# Patient Record
Sex: Female | Born: 1970 | Race: White | Hispanic: No | Marital: Married | Smoking: Current every day smoker
Health system: Southern US, Community
[De-identification: ages and names within clinical notes are randomized; demographics above are authoritative.]

## PROBLEM LIST (undated history)

## (undated) DIAGNOSIS — C539 Malignant neoplasm of cervix uteri, unspecified: Secondary | ICD-10-CM

## (undated) DIAGNOSIS — C44722 Squamous cell carcinoma of skin of right lower limb, including hip: Secondary | ICD-10-CM

## (undated) DIAGNOSIS — N938 Other specified abnormal uterine and vaginal bleeding: Secondary | ICD-10-CM

## (undated) DIAGNOSIS — Z87412 Personal history of vulvar dysplasia: Secondary | ICD-10-CM

## (undated) DIAGNOSIS — Z87411 Personal history of vaginal dysplasia: Secondary | ICD-10-CM

## (undated) DIAGNOSIS — Z8759 Personal history of other complications of pregnancy, childbirth and the puerperium: Secondary | ICD-10-CM

## (undated) DIAGNOSIS — E785 Hyperlipidemia, unspecified: Secondary | ICD-10-CM

## (undated) DIAGNOSIS — D013 Carcinoma in situ of anus and anal canal: Secondary | ICD-10-CM

## (undated) DIAGNOSIS — Z87410 Personal history of cervical dysplasia: Secondary | ICD-10-CM

## (undated) DIAGNOSIS — Z973 Presence of spectacles and contact lenses: Secondary | ICD-10-CM

## (undated) DIAGNOSIS — I10 Essential (primary) hypertension: Secondary | ICD-10-CM

## (undated) DIAGNOSIS — F419 Anxiety disorder, unspecified: Secondary | ICD-10-CM

## (undated) DIAGNOSIS — T7840XA Allergy, unspecified, initial encounter: Secondary | ICD-10-CM

## (undated) HISTORY — PX: COLONOSCOPY: SHX174

## (undated) HISTORY — DX: Allergy, unspecified, initial encounter: T78.40XA

## (undated) HISTORY — DX: Essential (primary) hypertension: I10

## (undated) HISTORY — PX: MOUTH SURGERY: SHX715

## (undated) HISTORY — DX: Squamous cell carcinoma of skin of right lower limb, including hip: C44.722

## (undated) HISTORY — DX: Anxiety disorder, unspecified: F41.9

## (undated) HISTORY — DX: Malignant neoplasm of cervix uteri, unspecified: C53.9

---

## 1997-11-14 ENCOUNTER — Other Ambulatory Visit: Admission: RE | Admit: 1997-11-14 | Discharge: 1997-11-14 | Payer: Self-pay | Admitting: Gynecology

## 1999-05-01 ENCOUNTER — Inpatient Hospital Stay (HOSPITAL_COMMUNITY): Admission: AD | Admit: 1999-05-01 | Discharge: 1999-05-01 | Payer: Self-pay | Admitting: Gynecology

## 1999-05-07 ENCOUNTER — Encounter (INDEPENDENT_AMBULATORY_CARE_PROVIDER_SITE_OTHER): Payer: Self-pay

## 1999-05-07 ENCOUNTER — Encounter: Payer: Self-pay | Admitting: Gynecology

## 1999-05-07 HISTORY — PX: OTHER SURGICAL HISTORY: SHX169

## 1999-05-08 ENCOUNTER — Inpatient Hospital Stay (HOSPITAL_COMMUNITY): Admission: AD | Admit: 1999-05-08 | Discharge: 1999-05-09 | Payer: Self-pay | Admitting: Gynecology

## 1999-08-18 ENCOUNTER — Other Ambulatory Visit: Admission: RE | Admit: 1999-08-18 | Discharge: 1999-08-18 | Payer: Self-pay | Admitting: Gynecology

## 1999-10-09 ENCOUNTER — Other Ambulatory Visit: Admission: RE | Admit: 1999-10-09 | Discharge: 1999-10-09 | Payer: Self-pay | Admitting: Gynecology

## 1999-10-09 ENCOUNTER — Encounter (INDEPENDENT_AMBULATORY_CARE_PROVIDER_SITE_OTHER): Payer: Self-pay

## 1999-11-10 ENCOUNTER — Ambulatory Visit (HOSPITAL_COMMUNITY): Admission: RE | Admit: 1999-11-10 | Discharge: 1999-11-10 | Payer: Self-pay | Admitting: Gynecology

## 1999-11-10 HISTORY — PX: OTHER SURGICAL HISTORY: SHX169

## 2000-02-11 ENCOUNTER — Other Ambulatory Visit: Admission: RE | Admit: 2000-02-11 | Discharge: 2000-02-11 | Payer: Self-pay | Admitting: Gynecology

## 2000-02-12 ENCOUNTER — Other Ambulatory Visit: Admission: RE | Admit: 2000-02-12 | Discharge: 2000-02-12 | Payer: Self-pay | Admitting: Gynecology

## 2000-02-12 ENCOUNTER — Encounter (INDEPENDENT_AMBULATORY_CARE_PROVIDER_SITE_OTHER): Payer: Self-pay | Admitting: Specialist

## 2000-03-02 ENCOUNTER — Other Ambulatory Visit: Admission: RE | Admit: 2000-03-02 | Discharge: 2000-03-02 | Payer: Self-pay | Admitting: Gynecology

## 2000-03-02 ENCOUNTER — Encounter (INDEPENDENT_AMBULATORY_CARE_PROVIDER_SITE_OTHER): Payer: Self-pay

## 2000-03-10 ENCOUNTER — Encounter (INDEPENDENT_AMBULATORY_CARE_PROVIDER_SITE_OTHER): Payer: Self-pay

## 2000-03-10 ENCOUNTER — Ambulatory Visit (HOSPITAL_COMMUNITY): Admission: RE | Admit: 2000-03-10 | Discharge: 2000-03-10 | Payer: Self-pay | Admitting: Gynecology

## 2000-03-10 HISTORY — PX: OTHER SURGICAL HISTORY: SHX169

## 2000-06-13 ENCOUNTER — Other Ambulatory Visit: Admission: RE | Admit: 2000-06-13 | Discharge: 2000-06-13 | Payer: Self-pay | Admitting: Gynecology

## 2000-07-19 ENCOUNTER — Other Ambulatory Visit: Admission: RE | Admit: 2000-07-19 | Discharge: 2000-07-19 | Payer: Self-pay | Admitting: Gynecology

## 2000-09-19 ENCOUNTER — Other Ambulatory Visit: Admission: RE | Admit: 2000-09-19 | Discharge: 2000-09-19 | Payer: Self-pay | Admitting: Gynecology

## 2000-09-20 ENCOUNTER — Other Ambulatory Visit: Admission: RE | Admit: 2000-09-20 | Discharge: 2000-09-20 | Payer: Self-pay | Admitting: Gynecology

## 2000-09-20 ENCOUNTER — Encounter (INDEPENDENT_AMBULATORY_CARE_PROVIDER_SITE_OTHER): Payer: Self-pay | Admitting: *Deleted

## 2000-11-04 ENCOUNTER — Other Ambulatory Visit: Admission: RE | Admit: 2000-11-04 | Discharge: 2000-11-04 | Payer: Self-pay | Admitting: Gynecology

## 2001-04-27 ENCOUNTER — Other Ambulatory Visit: Admission: RE | Admit: 2001-04-27 | Discharge: 2001-04-27 | Payer: Self-pay | Admitting: Gynecology

## 2001-07-07 ENCOUNTER — Ambulatory Visit (HOSPITAL_COMMUNITY): Admission: RE | Admit: 2001-07-07 | Discharge: 2001-07-07 | Payer: Self-pay | Admitting: Gynecology

## 2001-07-07 ENCOUNTER — Encounter: Payer: Self-pay | Admitting: Gynecology

## 2002-10-18 ENCOUNTER — Other Ambulatory Visit: Admission: RE | Admit: 2002-10-18 | Discharge: 2002-10-18 | Payer: Self-pay | Admitting: Gynecology

## 2002-12-06 ENCOUNTER — Inpatient Hospital Stay (HOSPITAL_COMMUNITY): Admission: AD | Admit: 2002-12-06 | Discharge: 2002-12-07 | Payer: Self-pay | Admitting: Gynecology

## 2002-12-06 ENCOUNTER — Encounter (INDEPENDENT_AMBULATORY_CARE_PROVIDER_SITE_OTHER): Payer: Self-pay

## 2002-12-06 HISTORY — PX: OTHER SURGICAL HISTORY: SHX169

## 2003-10-21 ENCOUNTER — Other Ambulatory Visit: Admission: RE | Admit: 2003-10-21 | Discharge: 2003-10-21 | Payer: Self-pay | Admitting: Gynecology

## 2004-11-12 ENCOUNTER — Other Ambulatory Visit: Admission: RE | Admit: 2004-11-12 | Discharge: 2004-11-12 | Payer: Self-pay | Admitting: Gynecology

## 2005-12-21 ENCOUNTER — Other Ambulatory Visit: Admission: RE | Admit: 2005-12-21 | Discharge: 2005-12-21 | Payer: Self-pay | Admitting: Gynecology

## 2006-12-23 ENCOUNTER — Other Ambulatory Visit: Admission: RE | Admit: 2006-12-23 | Discharge: 2006-12-23 | Payer: Self-pay | Admitting: Gynecology

## 2008-01-08 ENCOUNTER — Other Ambulatory Visit: Admission: RE | Admit: 2008-01-08 | Discharge: 2008-01-08 | Payer: Self-pay | Admitting: Gynecology

## 2008-01-08 ENCOUNTER — Ambulatory Visit: Payer: Self-pay | Admitting: Gynecology

## 2008-01-08 ENCOUNTER — Encounter: Payer: Self-pay | Admitting: Gynecology

## 2008-01-30 ENCOUNTER — Encounter: Payer: Self-pay | Admitting: Gynecology

## 2008-01-30 ENCOUNTER — Ambulatory Visit: Payer: Self-pay | Admitting: Gynecology

## 2009-01-08 ENCOUNTER — Other Ambulatory Visit: Admission: RE | Admit: 2009-01-08 | Discharge: 2009-01-08 | Payer: Self-pay | Admitting: Gynecology

## 2009-01-08 ENCOUNTER — Encounter: Payer: Self-pay | Admitting: Gynecology

## 2009-01-08 ENCOUNTER — Ambulatory Visit: Payer: Self-pay | Admitting: Gynecology

## 2009-01-17 ENCOUNTER — Encounter: Admission: RE | Admit: 2009-01-17 | Discharge: 2009-01-17 | Payer: Self-pay | Admitting: Gynecology

## 2009-07-07 ENCOUNTER — Ambulatory Visit: Payer: Self-pay | Admitting: Gynecology

## 2010-01-15 ENCOUNTER — Ambulatory Visit: Payer: Self-pay | Admitting: Gynecology

## 2010-01-15 ENCOUNTER — Other Ambulatory Visit: Admission: RE | Admit: 2010-01-15 | Discharge: 2010-01-15 | Payer: Self-pay | Admitting: Gynecology

## 2010-07-15 ENCOUNTER — Ambulatory Visit (INDEPENDENT_AMBULATORY_CARE_PROVIDER_SITE_OTHER): Payer: Managed Care, Other (non HMO) | Admitting: Gynecology

## 2010-07-15 DIAGNOSIS — D0730 Carcinoma in situ of unspecified female genital organs: Secondary | ICD-10-CM

## 2010-07-17 IMAGING — CR DG CHEST 2V
2 series · 2 of 2 positions shown · non-contrast
Comparison: None

CLINICAL DATA: Smoking history

CHEST - 2 VIEW

[view not recorded (1 of 2)]
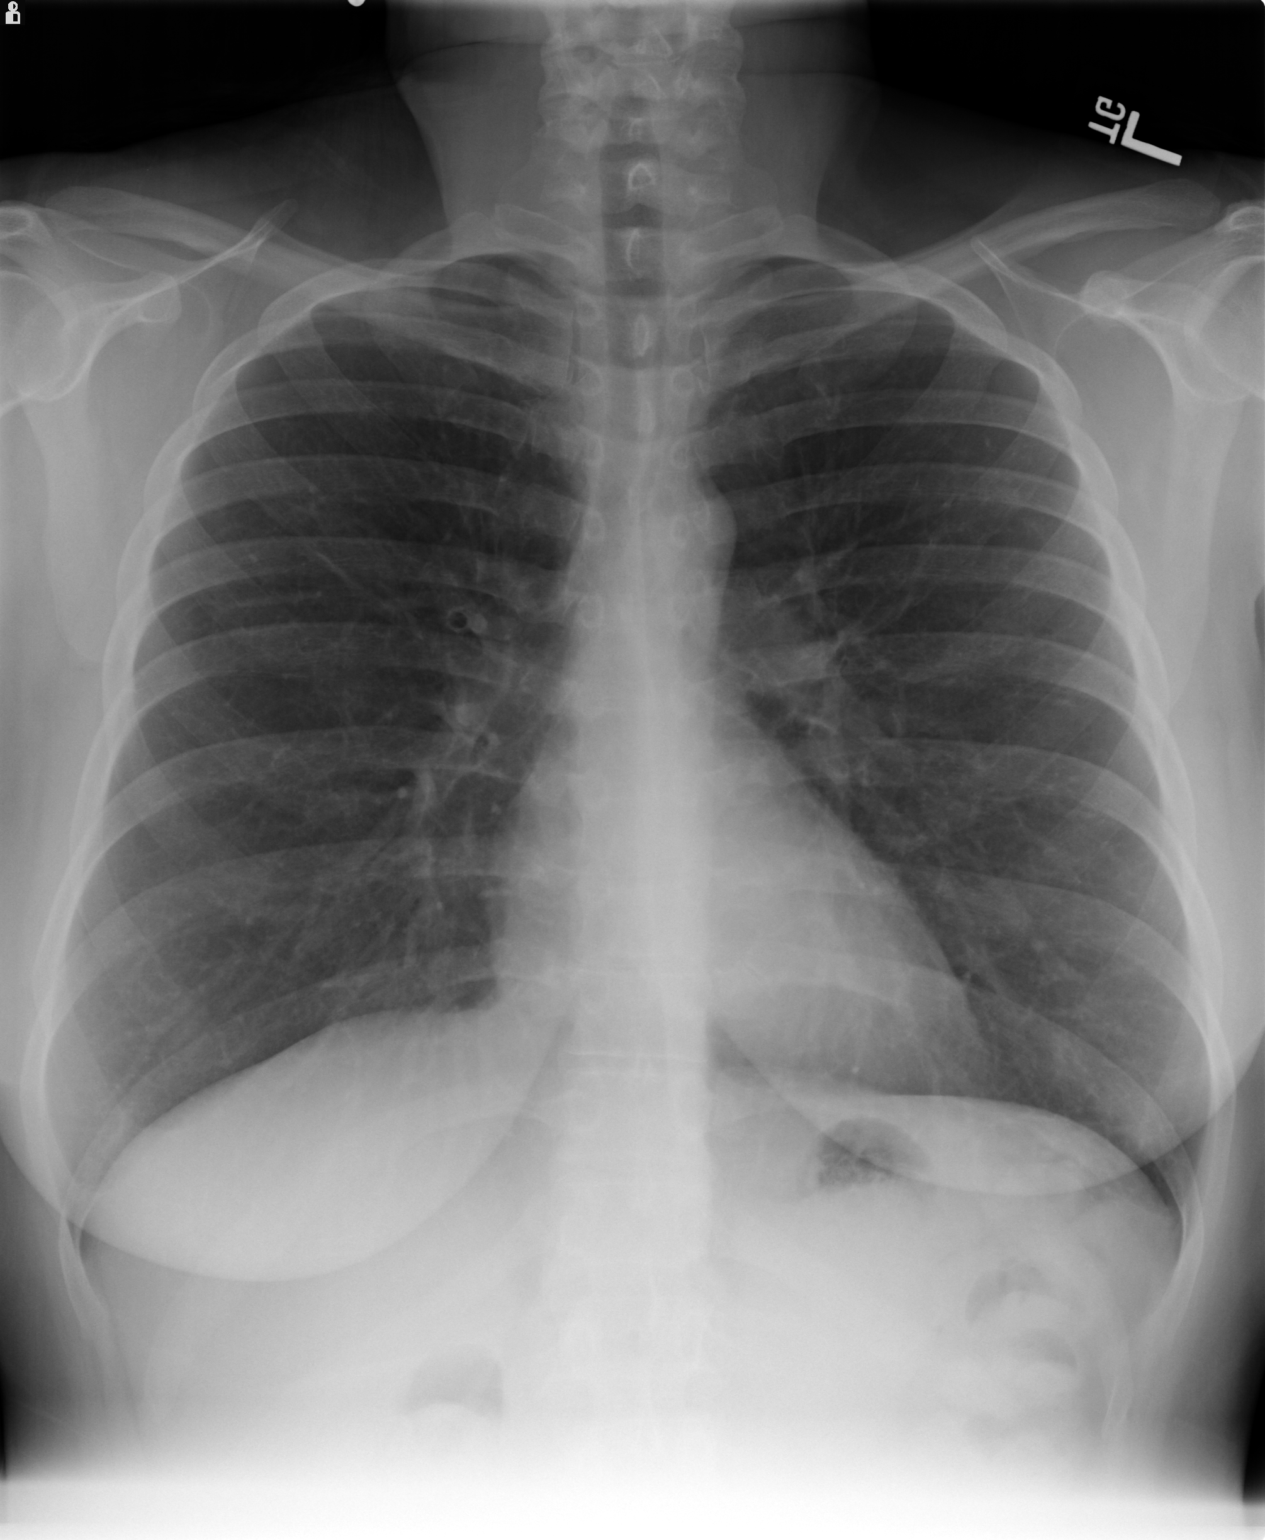

[view not recorded (2 of 2)]
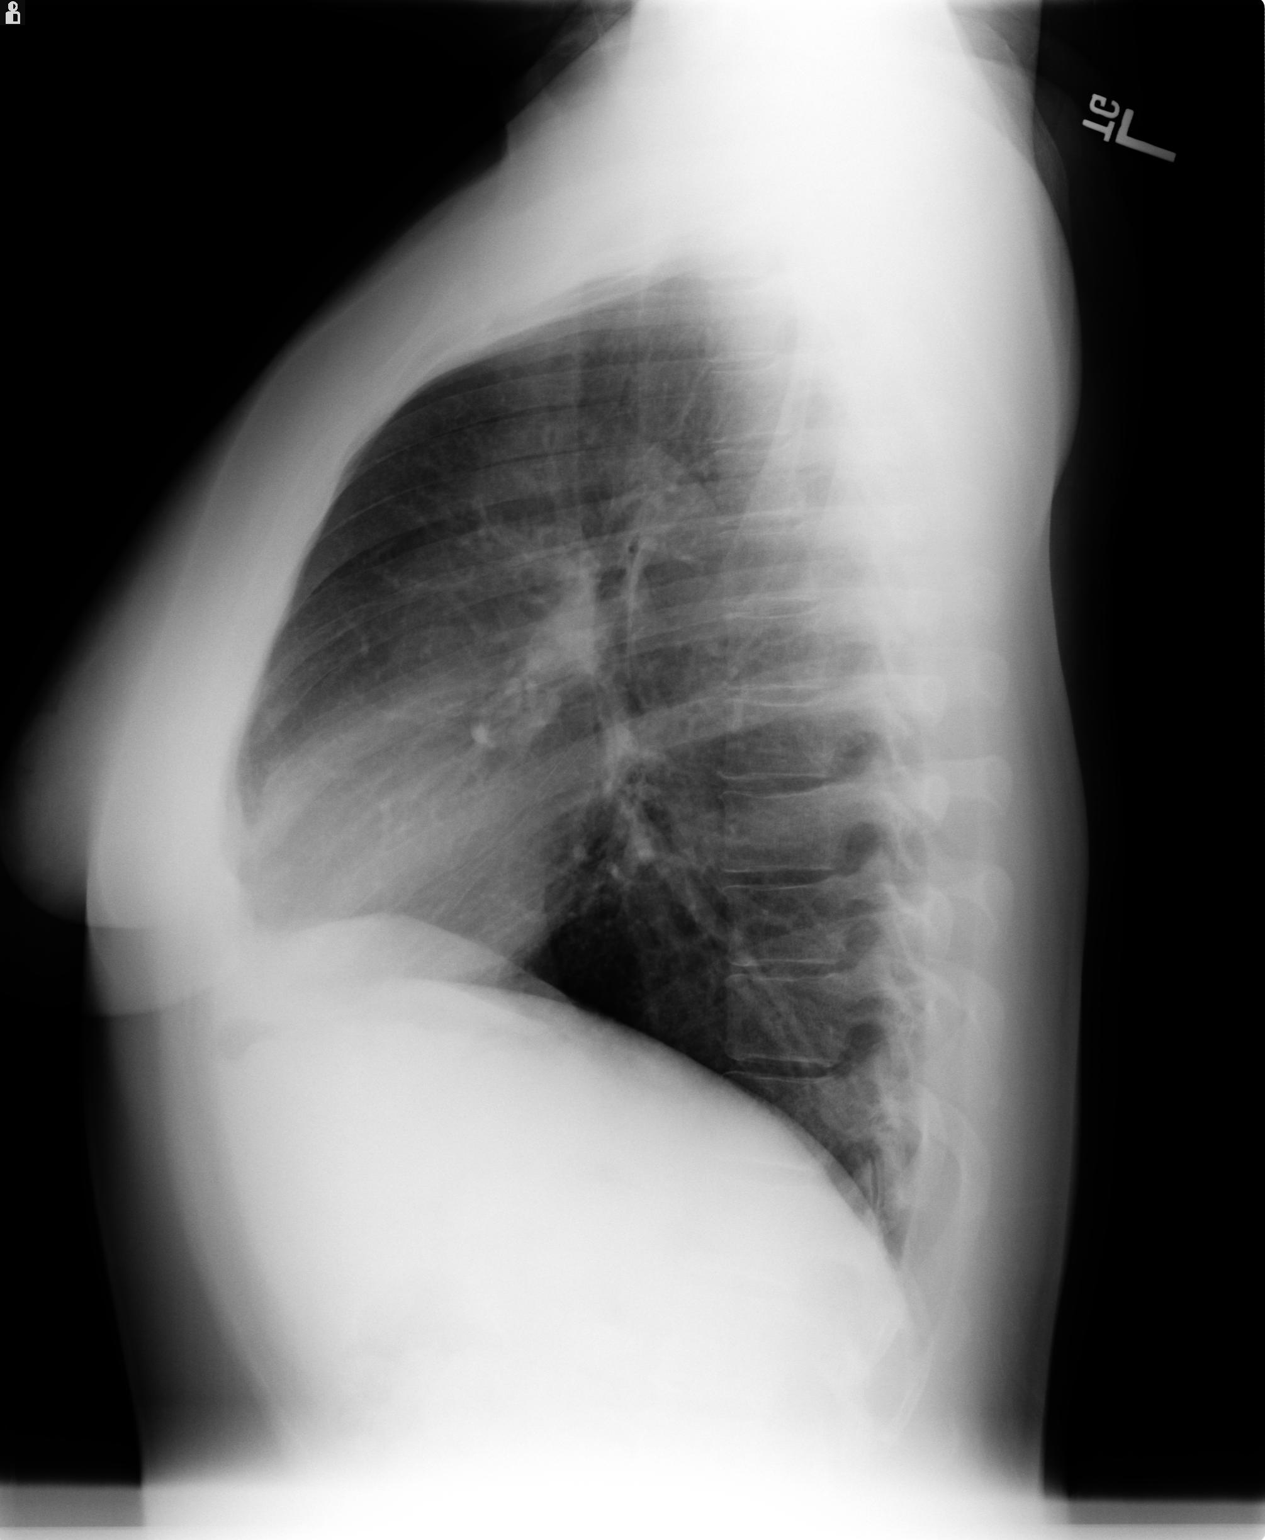

[2 of 2 positions shown; findings below may reference images not displayed]

FINDINGS: The lungs are clear and hyperaerated.  The heart is
within normal limits in size.  No bony abnormality is seen.
IMPRESSION: No active lung disease.  Slight hyperaeration.

## 2010-08-28 NOTE — H&P (Signed)
[facility]  Patient:    Maureen Wall, Maureen Wall                    MRN: [id number] Adm. Date:  [id number] Disc. Date: [id number] Attending:  Tonye Royalty                         History and Physical  The patient is scheduled for surgery on March 10, 2000, at 1 p.m. at [facility].  Please have History & Physical available.  CHIEF COMPLAINT:              1. 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                               2. Bowens disease of left labia minora.                               3. 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  HISTORY:                      The patient is a 40 year old gravida 2, para 0, AB 2 (previous history of right salpingectomy January 2001 secondary to ectopic pregnancy and also missed abortion).  The patient does not use any form of contraception.  She was seen back in the office on November 16 for detailed colposcopic evaluation of the cervix and external genitalia.  She had been seen in the office on November 7 for a followup Pap smear due to the fact that she had CO2 laser ablation of the left vaginal fornix for vaginal intraepithelial neoplasia and left labia minora like lesion.  Her Pap smear demonstrated prior to that procedure CIN-1, but it had not been seen on colposcopic evaluation, so no treatment of cervix was done at that time.  At her followup visit, her Pap smear was repeated, one in the cervix and one in the left vaginal fornix, and both came back CIN-1/VAIN-1.  On the visit of November 1, an ______ lesion of left labium minora was seen, and a keypunch biopsy was obtained.  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  She returned to the office on November 16 where Lugol solution was applied to the vagina, cervix, and external genitalia.  An area of decreased uptake at 12 oclock  at the ectocervix was noted and was biopsied as well as the left vaginal fornix and left vaginal sidewall.  No additional external genital lesions were noted except for the areas of Bowens disease as diagnosed on the left labia minora. The biopsy reports came back cervical biopsy at 12 oclock of the ectocervix surprisingly benign squamous mucosa associated with hyperkeratosis but no dysplasia identified.  The left vaginal fornix and left vaginal sidewall had slight squamous dysplasia VAIN-1, respectively.  The case was discussed with GYN oncologist, Dr. Rande Brunt. Clarke-Pearson who agreed to proceed with wide local excision of the Bowens diseased area of the labia and CO2 laser of the vaginal fornix/cervix.  PAST MEDICAL HISTORY:  1. History of CO2 laser of left vaginal fornix                                  and left labia minora.                               2. History of right salpingectomy in January                                  2001 for right ectopic pregnancy.  ALLERGIES:                    SEPTRA.  GYNECOLOGIC HISTORY:          She has been using barrier contraception.  In 1987, she had a cervical conization and her menarche at age 46.  FAMILY HISTORY:               Brother with history of diabetes, grandmother with history of hypertension.  PHYSICAL EXAMINATION:  GENERAL:                      Well-developed, well-nourished female.  HEENT:                        Unremarkable.  NECK:                         Supple.  Trachea midline.  No carotid bruits, no thyromegaly.  LUNGS:                        Clear to auscultation without rhonchi or wheezes.  HEART:                        Regular rate and rhythm with no murmurs or gallops.  BREASTS:                      Exam not done.  ABDOMEN:                      Soft, nontender.  PELVIC:                       Bartholin, urethra, and Skene within normal limits.  Vagina and cervix as described above.  External  genitalia as described above.  ASSESSMENT:                   A 40 year old gravida 2, para 0, AB 2, with history of recurrent vaginal intraepithelial neoplasia of left vaginal fornix and left vaginal sidewall.  Also, Bowens disease of the left labia minora.  PLAN:                         Proceed with CO2 ablation of the cervix, left vaginal fornix, and left vaginal sidewall along with wide local excision of the left labia minora lesion (Bowens disease).  Risks and benefits of the procedure were discussed including infection, bleeding, trauma from the laser, penetration through vagina into abdominal cavity requiring open laparotomy and corrective surgery, increased risk of cervical stenosis, cervical incompetence, premature delivery due to the  above-mentioned procedure.  All these issues were discussed in detail with the patient, also increased risk for recurrence and also the risk for underlying lesions that had not been visible colposcopically or by visual inspection that could flare up at a later date.  All these issues were discussed in detail with the patient.  All questions were answered and will follow accordingly.  The patient is scheduled for CO2 laser ablation of the cervix, left vaginal fornix, and left vaginal sidewall and wide local excision of left labia minora Bowens disease.  The patients surgery is scheduled for tomorrow at 1 p.m. at [facility].  Please have History & Physical available. DD:  03/09/00 TD:  03/09/00 Job: [id number]

## 2010-08-28 NOTE — Op Note (Signed)
NAMEFARRIN, Maureen Wall                       ACCOUNT NO.:  [id number]   MEDICAL RECORD NO.:  [id number]                   PATIENT TYPE:  INP   LOCATION:  9399                                 FACILITY:  [facility]   PHYSICIAN:  Juan H. Lily Peer, M.D.             DATE OF BIRTH:  11-Apr-1971   DATE OF PROCEDURE:  12/06/2002  DATE OF DISCHARGE:                                 OPERATIVE REPORT   SURGEON:  Juan H. Lily Peer, M.D.   FIRST ASSISTANT:  Ivor Costa. Farrel Gobble, M.D.   INDICATION FOR OPERATION:  This 40 year old gravida 3, para 0, AB 2, (one  prior ectopic, right/previous salpingectomy, and one missed AB), currently 5  1/2 weeks' estimated gestational age.  The patient complaining with left  lower quadrant pain and ultrasound highly suspicious for a left ectopic  pregnancy whereby cardiac activity was noted within the adnexa.   PREOPERATIVE DIAGNOSIS:  Left ectopic pregnancy.   POSTOPERATIVE DIAGNOSIS:  Left ectopic pregnancy.   ANESTHESIA:  General endotracheal anesthesia.   PROCEDURES PERFORMED:  1. Exploratory laparotomy.  2. Lysis of abdominal and pelvic adhesions.  3. Left salpingo-oophorectomy.   FINDINGS:  1. Abdominopelvic adhesions.  2. Left ampullary isthmic ectopic pregnancy, ruptured.  3. Hemoperitoneum.  4. Right ovary absent and absent right fallopian tube.   DESCRIPTION OF OPERATION:  After the patient was adequately counseled she  was taken to the operating room where she underwent a successful general  endotracheal anesthesia.  Foley catheter was inserted to monitor urinary  output and the abdomen was prepped and draped in the usual sterile fashion.  A Pfannenstiel skin incision was made adjacent to the previous Pfannenstiel  scar.  Incision was carried down through the skin, subcutaneous tissue, down  to the rectus fascia, whereby a midline nick was made and the peritoneal  cavity was entered and immediately yellow segments of omentum appeared to be  adhered to the anterior abdominal wall requiring meticulous dissection and  tying off omentum segments in an effort to gain access to the peritoneal  cavity.  Once this was accomplished the patient was placed in slight  Trendelenburg position and the O'Connor-O'Sullivan retractor was utilized  for exposure.  Once this was accomplished it was found that there was  evidence of hemoperitoneum and there was an ampullary isthmic ectopic  pregnancy incased in adhesion to the ovary and a lot of inflammatory  reaction.  We required meticulous dissection in effort to free the  adhesions.  The tissue was very friable on contact.  The mesosalpinx was  serially clamped with a Haney clamp.  Due to the amount of inflammatory  reaction that was present and the friability of the tissue, the left tube  and ovary had to be removed and the remaining infundibulopelvic ligament was  free tied with 0 Vicryl suture x2.  The uterotubal junction was secured with  a figure-of-eight of 0 Vicryl suture.  The pelvic cavity  was copiously  irrigated with a normal saline solution.  The retroperitoneal space was  evaluated to identify the course of the ureter and for additional hemostasis  Gelfoam was placed in the area.  Inspection of the contralateral ovary  appeared to be normal.  No evidence of endometriosis and it was tucked back  behind the uterus.  The sponges were removed and the sponge count and needle  count were correct.  After ascertaining adequate hemostasis, the visceral  peritoneum was not closed.  The rectus fascia was closed in a running stitch  of 0 Vicryl suture.  The subcutaneous bleeders were Bovie cauterized.  The  skin was reapproximated with skin clips followed by a placement of Xeroform  gauze and 4 x 8 dressing.  The patient was extubated and transferred to  recovery room with stable vital signs.  Blood loss from the procedure was  200 mL, urine output was 400 mL, and IV fluids was 2,200 mL of  lactated  Ringers. She did receive 2 grams of Cefotan prophylactically and had PSA  stockings for DVT prophylaxis.  Her blood type is 0-positive.                                               Juan H. Lily Peer, M.D.    JHF/MEDQ  D:  12/06/2002  T:  12/06/2002  Job:  [id number]

## 2010-08-28 NOTE — Discharge Summary (Signed)
Maureen Wall, PENNEBAKER                       ACCOUNT NO.:  [id number]   MEDICAL RECORD NO.:  [id number]                   PATIENT TYPE:  OBV   LOCATION:  9399                                 FACILITY:  [facility]   PHYSICIAN:  Juan H. Lily Peer, M.D.             DATE OF BIRTH:  11/30/1970   DATE OF ADMISSION:  12/06/2002  DATE OF DISCHARGE:                                 DISCHARGE SUMMARY   CHIEF COMPLAINT:  The patient is a 40 year old gravida 3 para 0 AB 2 (prior  miscarriage and prior right salpingectomy).  The patient is currently five-  and-a-half weeks into her pregnancy.  This pregnancy is a result of in vitro  fertilization.  Embryo transfer was on November 08, 2002.  The patient has been  seen in the office several times - August 19, August 23, and on August 24 -  secondary to some vaginal bleeding and crampiness she was having.  Her blood  type is O positive.  Her quantitative beta hCG on August 12 was 24, August  14 was 158, August 16 was 194, and August 18 738, and on August 19 it had  dropped to 472.  At that office visit on August 19 we had offered her either  awaiting spontaneous passage of the missed AB or proceeding with the D&E.  She opted to wait.  She was supposed to return for a follow-up quantitative  beta hCG and, surprisingly, on August 23 a quantitative beta hCG had risen  to 3254.  It was repeated on August 24 and had risen to 4478.  She had an  ultrasound that demonstrated an endometrial stripe of 5.1 mm but no  intrauterine gestational sac was seen, negative adnexal region, no  abnormality seen, normal ovaries, no free fluid.  She presented today to the  office complaining of increased bleeding along with left lower quadrant  cramping.  An ultrasound demonstrated the right ovary was normal.  In the  endometrial cavity the endometrium was 5.4 mm.  There was no intrauterine  pregnancy.  The left ovary measured 4.5 x 3.5 x 3.0 cm.  Inferior to the  ovary is an  echogenic mass measuring 5.3 x 2.5 x 3.1 cm with a gestational  sac consistent with 10.3 mm and a yolk sac consistent with 5.7 mm.  Viable  crown-rump length was measured at 3.2 mm consistent with five weeks and six  days and cardiac activity was appreciated with positive vascular flow to the  suspicious mass.  With these findings I informed Maureen Wall that she has an  ectopic pregnancy.  Review of records indicated in the past with her  previous ectopic pregnancy laparoscopic attempt was unsuccessful and  resulted in open laparotomy with a right salpingectomy for early ectopic  pregnancy.  Since she has been unsuccessful in conceiving by superovulation  and resorted to in vitro it would be better off at this point to remove  her  left fallopian tube and several months after she recovers from her surgery  proceed in continuation of IVF at [facility].   ALLERGIES:  SEPTRA and MORPHINE.   PAST MEDICAL HISTORY:  Wide local excision and CO2 laser of the cervix and  vagina.  She has had history of VIN 3 and cervical dysplasia, history of  prior right ectopic resulting i right salpingectomy.  She smokes a half a  pack of cigarettes per day.   FAMILY HISTORY:  Brother is insulin-dependent diabetic, sister non-insulin-  dependent diabetic, and grandmother history of hypertension.   PHYSICAL EXAMINATION:  VITAL SIGNS:  The patient's weight is 181 pounds, 5  feet 2 inches tall.  Blood pressure 120/82.  HEENT:  Unremarkable.  NECK:  Supple, trachea midline.  No carotid bruits, no thyromegaly.  LUNGS:  Clear to auscultation without rhonchi or wheezes.  HEART:  Regular rate and rhythm.  No murmurs or gallops.  BREAST:  Not done.  ABDOMEN:  Soft.  Some tenderness in the left lower quadrant.  No rebound or  guarding.  PELVIC:  Some blood in her vaginal vault.  Bimanual exam was not done due to  the fact that she was immediately taken to the ultrasound room for rule out  an ectopic pregnancy.  EXTREMITIES:   DTRs 1+, negative clonus, negative edema.   ASSESSMENT:  A 40 year old gravida 3 para 0 AB 2 (one right ectopic and one  missed AB), currently now five-and-a-half weeks estimated gestational age  with an abnormal increments on quantitive beta hCG.  The patient with left  lower quadrant pain.  Ultrasound demonstrated a left ectopic pregnancy with  cardiac activity consistent with five weeks and six days.  The patient will  undergo a mini laparotomy with a left salpingectomy.  We discussed that due  to the fact that she has been now unsuccessful with superovulation and has  now gone through several cycles of IVF that to decrease the likelihood of  her getting another ectopic pregnancy would be to proceed with a  salpingectomy.  Given the technical difficulties encountered before of  extensive abdominopelvic adhesions it was decided to proceed with a mini  laparotomy and with excision of the left fallopian tube.  Risks of the  operation to include infection, bleeding, trauma to internal organs.  The  patient will receive prophylaxis antibiotic.  She is also fully aware in the  event of any trauma to internal organs may require corrective surgery to  bowel, intestines, bladder, and internal structures as well.  Also, the risk  for deep venous thrombosis and pulmonary embolism.  Anesthesia will also  outline potential risks from general endotracheal anesthesia.  All this was  discussed with the patient and the husband, all questions were answered, and  will follow accordingly.   PLAN:  The patient is scheduled for emergency laparotomy with planned left  salpingectomy, possibly a left salpingo-oophorectomy.                                               Juan H. Lily Peer, M.D.    JHF/MEDQ  D:  12/06/2002  T:  12/06/2002  Job:  [id number]

## 2010-08-28 NOTE — Op Note (Signed)
[facility]  Patient:    Maureen Wall                     MRN: [id number] Proc. Date: 11/10/99 Adm. Date:  [id number] Attending:  Tonye Royalty                           Operative Report  PREOPERATIVE DIAGNOSIS:       Vaginal intraepithelial neoplasia I.  POSTOPERATIVE DIAGNOSIS:      Vaginal intraepithelial neoplasia I.  OPERATION:                    A CO2 laser ablation of VAIN I of the left fornix and the left labia minora.  SURGEON:                      Juan H. Lily Peer, M.D.  ASSISTANT:  ANESTHESIA:                   General endotracheal anesthesia.  INDICATIONS:                  A 40 year old female with recurrent dysplasia. Colposcopically directed biopsy demonstrated vaginal intraepithelial neoplasia I in the left vaginal fornix.  No cervical dysplasia noted on colposcopic biopsies. ECC was negative.  DESCRIPTION OF PROCEDURE:     After the patient was adequately counseled, she was taken to the operating room where she underwent a successful general endotracheal anesthesia.  She was placed in the low lithotomy position and drapes were placed around the vagina and perineal area and a coated speculum was inserted into the vaginal vault.  Acetic acid was placed into the vagina and a thorough inspection of the vaginal mucosa and cervix demonstrated a linear white raised area of the left vaginal fornix as previously had been seen in the office colposcopically.  The area of the left vaginal fornix and some scattered lesions on the left vaginal side wall as well as two small isolated lesions noted in the inner surface of the labia majora near the fourchette.  The CO2 laser was utilized for the vaginal fornix. It was set at 10 watts and in a paintbrush-like manner the fornix and vaginal wall lesions were ablated to approximately a depth of 2 mm.  The two isolated lesions of the inner portion of the labia minora were ablated with  the CO2 laser also set at 10 watts for approximately 2 mm depth.  After this, ___________ was placed into the vaginal fornix and the external genitalia for postoperative pain relief.  She was given 30 mg of Toradol IV and transferred to the recovery room where she was extubated with stable vital signs.  Blood loss was minimal. Fluid resuscitation consisted of 1000 cc of lactated Ringers. DD:  11/10/99 TD:  11/11/99 Job: [id number]

## 2010-08-28 NOTE — Op Note (Signed)
[facility]  Patient:    JOYELL, EMAMI                      MRN: [id number] Proc. Date: 03/10/00 Attending:  Gaetano Hawthorne. Lily Peer, M.D.                           Operative Report  INDICATION FOR OPERATION:     A 40 year old gravida 2, para 0, AB 2 with history of recurrent vaginal intraepithelial neoplasia I of the left vaginal fornix and recurrent CIN1 and also Bowens disease of the labia minor (vulvar intraepithelial neoplasia III).  PREOPERATIVE DIAGNOSES:       1. Recurrent vaginal intraepithelial neoplasia                                  of the left vaginal fornix and left vaginal                                  sidewall.                               2. Recurrent cervical intraepithelial                                  neoplasia I.                               3. Bowens disease (vulvar intraepithelial                                  neoplasia III).  POSTOPERATIVE DIAGNOSES:      1. Recurrent vaginal intraepithelial neoplasia                                  of the left vaginal fornix and left vaginal                                  sidewall.                               2. Recurrent cervical intraepithelial                                  neoplasia I.                               3. Bowens disease (vulvar intraepithelial                                  neoplasia III).  ANESTHESIA:                   General endotracheal anesthesia.  PROCEDURES PERFORMED:  1. CO2 laser ablation of cervical and vaginal                                  intraepithelial neoplasia.                               2. Wide local excision of left labia minor,                                  vulvar intraepithelial neoplasia III.  FINDINGS:                     With the use of the colposcope, the entire vaginal tube was moistened with acetic acid ______ wide lesion was noted at the 12 oclock position of the ectocervix also in the left vaginal fornix  and the left vaginal sidewall.  Also the area where a previous key punch biopsy was done on the lower one-third of the left labia minor several small areas of ______ white lesions were noted where the area where the wide local excision was made.  The rest of the external genitalia was normal.  DESCRIPTION OF OPERATION:     After the patient was taken to the operating room, she underwent a successful general endotracheal anesthesia  and she was placed in the low lithotomy position.  The bladder was evacuated of its contents with a red rubber Roxan Hockey for approximately 50 cc.  A titanium coated speculum was placed into the vaginal vault, moist towels were placed around the external genitalia to prevent a fire from occurring from the laser beam.  Once the speculum was in place, a systematic inspection of the entire external, as well as vaginal mucosa and cervix were inspected.  Attention was then placed first through the ectocervix and a circumferential marking with the CO2 laser was made giving at least a 5 mm margin away from the ectocervical lesion.  The CO2 laser was set at 15 watts, continuous mode with a spot size of approximately 2 mm.  The cervix was demarcated was laser ablated to approximately a depth of 4-6 mm.  After this, the wattage was changed to 6 watts continuous mode and a spot size of 2 mm whereby the left vaginal fornix and left vaginal sidewall was brushed in a systematic fashion allowing at least a 3-4 mm distance from the multifocal lesions that were previously noted and biopsied preoperatively.  Once this was completed, attention was focused then on the left labia minor, where a marking pen the area was demarcated to allow significant distance from the lesion that had been biopsied, which had demonstrated VIN3.  Once this was demarcated with the use of a #6 size scalpel, the area was excised placed on a cork and marked and submitted for histologic evaluation.  The base  was cauterized with silver nitrate and the edges were reapproximated with a running stitch of 4-0 Monocryl in a running stitch fashion.  After this, ______ cream was applied intravaginally.  A red rubber Robinson once again inserted into the bladder and approximately 25 cc were obtained.  The patient was extubated and transferred to recovery room with stable vital signs.  Blood loss was minimal. Of note, the power densities from the CO2 laser were as follows: for the cervix it  was 750 watts per cm.sq. and for the vaginal fornix and left vaginal sidewall was 300 watts per cm.sq. DD:  03/10/00 TD:  03/10/00 Job: [id number]

## 2010-08-28 NOTE — Discharge Summary (Signed)
   [name], Maureen Wall                       ACCOUNT NO.:  [id number]   MEDICAL RECORD NO.:  [id number]                   PATIENT TYPE:  INP   LOCATION:  9302                                 FACILITY:  [facility]   PHYSICIAN:  Juan H. Lily Peer, M.D.             DATE OF BIRTH:  15-Oct-1970   DATE OF ADMISSION:  12/06/2002  DATE OF DISCHARGE:  12/07/2002                                 DISCHARGE SUMMARY   DISCHARGE DIAGNOSIS:  Right ectopic pregnancy.   PROCEDURE:  Exploratory laparotomy and left salpingo-oophorectomy.   HISTORY OF PRESENT ILLNESS:  A 40 year old gravida 3 para 0 AB 2 with  history of a prior miscarriage and a right salpingectomy; right ovary is  intact.  The patient is currently five-and-a-half weeks pregnant.  The  pregnancy is the result of in vitro fertilization.  Embryo transfer was on  November 08, 2002.  She had been seen in the office several times secondary to  some vaginal bleeding and cramping.  Blood type is O positive.  An  ultrasound demonstrated that the right ovary was normal.  Endometrium was  5.4 mm and there was no IUP identified.  The left ovary measured normal with  an echogenic mass measuring 5.3 x 2.5 x 3.1 with a gestational sac  consistent with a 10.3 mm yolk sac.  A viable crown-rump length was measured  which was consistent with 5.6 days with cardiac activity.  She was admitted  to the hospital for surgical removal for this left ectopic.   LABORATORY DATA:  She is positive blood type.   HOSPITAL COURSE AND TREATMENT:  She was admitted.  She had surgical removal  of a left ectopic, lysis of adhesions in the pelvis, and a left salpingo-  oophorectomy.  She was noted to have a ruptured left ampulla isthmic  ectopic.  The right ovary was present and appeared normal.  The right  fallopian tube was absent.  Postoperatively she remained afebrile, able to  void, and was discharged home in satisfactory condition on postoperative day  #1.   POSTOPERATIVE  LABORATORY DATA:  White count was 16.7, hemoglobin 12.1,  hematocrit 35.0, and platelets 301.   DISPOSITION:  1. She was discharged to home.  2. Instructed to follow up in the office in several days to have sutures     removed.  3. Prescription for Demerol 50 to take q.4-6h. as needed was given.     Davonna Belling. Young, N.P.                      Lars Mage H. Lily Peer, M.D.    [facility]  D:  12/20/2002  T:  12/20/2002  Job:  [id number]

## 2010-08-28 NOTE — Op Note (Signed)
[facility]  Patient:    Maureen Wall                       MRN: [id number] Proc. Date: 05/07/99 Adm. Date:  [id number] Attending:  Tonye Royalty                           Operative Report  INDICATIONS FOR SURGERY:      The patient is a 40 year old, gravida 2, para 0, B 1, at approximately 6-[redacted] weeks gestation, who had received methotrexate for erotic topic pregnancy, on the 19th of January, presented to the hospital today with acute right lower quadrant pain.  PREOPERATIVE DIAGNOSIS:       Right ectopic pregnancy.  POSTOPERATIVE DIAGNOSIS:      Right ectopic pregnancy.  ANESTHESIA:                   General endotracheal.  OPERATION:                    1. Attempted laparoscopic intervention of right                                  ectopic pregnancy.                               2. Exploratory laparotomy.                               3. Right salpingectomy.  FINDINGS:                     The patient had erotic topic pregnancy incorporating the entire tube, was bleeding at the distal end with large blood clots and approximately 500 cc of blood were in the pelvic cavity.  The right tube was adhered to the right pelvic sidewall.  The left tube was normal.  Mild filmy adhesions were noted with ______ fimbriated end and normal-appearing ovaries. Otherwise anterior and posterior cul-de-sac were unremarkable.  DESCRIPTION OF PROCEDURE:     After the patient was adequately counseled, she was taken to the operating room where she underwent a successful general endotracheal anesthesia.  She was placed in the low lithotomy position.  Examination under anesthesia demonstrated slightly anteverted uterus.  A Foley catheter was inserted in effort to monitor urinary output.  A single-tooth tenaculum was placed on the anterior cervical lip for uterine manipulation during laparoscopic procedure. After the drapes were in place, a small stab  incision was made in the area and he umbilicus following insertion of the Veress needle.  This was unsuccessful and pen laparoscopic technique was attempted with the use of Kelly clamps and the peritoneal cavity once entered, there appeared to be a lot of omentum and we could not obtain a good seal despite placing 0 Vicryl suture in the fascia to obtain  seal with open laparoscopic technique.  The laparoscope was removed.  The fascia was closed.  The subcutaneous tissue was reapproximated with 3-0 Vicryl suture nd the skin was reapproximated with a subcuticular stitch of 4-0 plain catgut suture. After this, a small Pfannenstiel skin incision was made 2 cm above the symphysis pubis and the incision was carried down from the skin and  subcutaneous tissue down to the rectus fascia where the fascia was then incised in a transverse fashion nd the midline raphae was entered.  The peritoneal cavity was entered cautiously.  Blood was noted to be present in the peritoneal cavity.  The OConnor-OSullivan retractors were in place.  At this point, the right fallopian tube appeared adhered to the right pelvic sidewall which was manually freed and incorporated the entire fallopian tube and large blood clots and blood was extruding from the distal portion of the tube.  The tube did not seem to be salvageable and a right salpingectomy was performed by placing Kelly clamps in the mesosalpinx in the proximal uterotubal junction.  With the Metzenbaum scissors the right fallopian  tube was excised and passed off the operative field.  The mesosalpinx was closed with interrupted sutures of 3-0 Vicryl suture.  After this, the pelvic cavity was copiously irrigated with normal saline solution.  Some mild adhesions were noted between the left tube and ovary which were freed with Metzenbaum scissors.  The  pelvic cavity was copiously irrigated with normal saline solution.  The contralateral tube looked  good with lush fimbriated end and both ovaries were otherwise normal.  INTERCEED as then placed over the right mesosalpinx region where the right tube was removed.  The visceral peritoneum was now reapproximated. The fascia was closed with a running stitch of 0 Vicryl suture.  The subcutaneous bleeders were Bovie cauterized.  The skin was reapproximated with skin clips followed by placement of Xeroform gauze and 4 x 8 dressing.  The patient was extubated, transferred to recovery room.  The Hulka tenaculum was removed.  The  patient with stable vital signs.  Blood loss approximately 500 cc.  IV fluids was 2500 cc of lactated Ringers.  Urine output was 150 cc.  The patient received a gram of Cefotan prophylactically and her blood type is O positive. DD:  05/07/99 TD:  05/09/99 Job: [id number]

## 2011-01-13 ENCOUNTER — Encounter: Payer: Self-pay | Admitting: Anesthesiology

## 2011-01-21 ENCOUNTER — Encounter: Payer: Self-pay | Admitting: Gynecology

## 2011-01-22 ENCOUNTER — Encounter: Payer: Self-pay | Admitting: Gynecology

## 2011-01-22 ENCOUNTER — Ambulatory Visit (INDEPENDENT_AMBULATORY_CARE_PROVIDER_SITE_OTHER): Payer: Managed Care, Other (non HMO) | Admitting: Gynecology

## 2011-01-22 ENCOUNTER — Other Ambulatory Visit (HOSPITAL_COMMUNITY)
Admission: RE | Admit: 2011-01-22 | Discharge: 2011-01-22 | Disposition: A | Discharge status: Home / Self Care | Payer: Managed Care, Other (non HMO) | Source: Ambulatory Visit | Attending: Gynecology | Admitting: Gynecology

## 2011-01-22 VITALS — BP 118/70 | Ht 62.5 in | Wt 181.0 lb

## 2011-01-22 DIAGNOSIS — F17200 Nicotine dependence, unspecified, uncomplicated: Secondary | ICD-10-CM

## 2011-01-22 DIAGNOSIS — Z01419 Encounter for gynecological examination (general) (routine) without abnormal findings: Secondary | ICD-10-CM

## 2011-01-22 DIAGNOSIS — D072 Carcinoma in situ of vagina: Secondary | ICD-10-CM | POA: Insufficient documentation

## 2011-01-22 DIAGNOSIS — E7800 Pure hypercholesterolemia, unspecified: Secondary | ICD-10-CM | POA: Insufficient documentation

## 2011-01-22 NOTE — Progress Notes (Signed)
Maureen Wall [birth year] [id number]   History:    40 y.o.  for annual exam without any complaints. The patient with past history of bilateral salpingectomies at different x2 ectopic pregnancies. The patient does her monthly self breast examination last mammogram was yesterday resulting in time of this dictation review records indicated she 1 Ab1 whose weight 159 last year she is being followed by [facility] medical practice and a [facility] and is scheduled for next weeks visit with a we'll be doing all her lab work. Patient has been counseled numerous times she still smokes a half of cigarettes cigarette per day. 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 Patient also with a past history of left salpingo-oophorectomy. She's currently on a statin for hypercholesterolemia. Patient reports normal menstrual cycles.  Past medical history,surgical history, family history and social history were all reviewed and documented in the EPIC chart.  ROS:  Was performed and pertinent positives and negatives are included in the history.  Exam: chaperone present BP 118/70  Ht 5' 2.5" (1.588 m)  Wt 181 lb (82.101 kg)  BMI 32.58 kg/m2  LMP 01/14/2011  Body mass index is 32.58 kg/(m^2).  General appearance : Well developed well nourished female. No acute distress HEENT: Neck supple, trachea midline, no carotid bruits, no thyroidmegaly Lungs: Clear to auscultation, no rhonchi or wheezes, or rib retractions  Heart: Regular rate and rhythm, no murmurs or gallops Breast:Examined in sitting and supine position were symmetrical in appearance, no palpable masses or tenderness,  no skin retraction, no nipple inversion, no nipple discharge, no skin discoloration, no axillary or supraclavicular lymphadenopathy Abdomen: no palpable masses or tenderness, no rebound or guarding Extremities: no edema or skin discoloration or tenderness  Pelvic:  Bartholin, Urethra, Skene  Glands: Within normal limits             Vagina: No gross lesions or discharge  Cervix: No gross lesions or discharge  Uterus  anteverted, normal size, shape and consistency, non-tender and mobile  Adnexa  Without masses or tenderness  Anus and perineum  normal   Rectovaginal  normal sphincter tone without palpated masses or tenderness             Hemoccult not done     Assessment/Plan:  40 y.o. female for annual exam unremarkable. The patient was counseled on the detrimental effects of smoking. She should be taking calcium 1200 mg -1500 mg daily along with 802,000 international units of vitamin D. Her Pap smear was done today she was instructed continue her monthly self breast examination and to have her primary physician fax is her labs so we can obtain her records.    Ok Edwards MD, 10:03 AM 01/22/2011

## 2012-02-10 ENCOUNTER — Other Ambulatory Visit (HOSPITAL_COMMUNITY)
Admission: RE | Admit: 2012-02-10 | Discharge: 2012-02-10 | Disposition: A | Discharge status: Home / Self Care | Payer: 59 | Source: Ambulatory Visit | Attending: Gynecology | Admitting: Gynecology

## 2012-02-10 ENCOUNTER — Encounter: Payer: Self-pay | Admitting: Gynecology

## 2012-02-10 ENCOUNTER — Ambulatory Visit (INDEPENDENT_AMBULATORY_CARE_PROVIDER_SITE_OTHER): Payer: 59 | Admitting: Gynecology

## 2012-02-10 VITALS — BP 128/88 | Ht 62.5 in | Wt 183.0 lb

## 2012-02-10 DIAGNOSIS — Z8759 Personal history of other complications of pregnancy, childbirth and the puerperium: Secondary | ICD-10-CM | POA: Insufficient documentation

## 2012-02-10 DIAGNOSIS — N870 Mild cervical dysplasia: Secondary | ICD-10-CM

## 2012-02-10 DIAGNOSIS — Z01419 Encounter for gynecological examination (general) (routine) without abnormal findings: Secondary | ICD-10-CM | POA: Insufficient documentation

## 2012-02-10 DIAGNOSIS — Z8742 Personal history of other diseases of the female genital tract: Secondary | ICD-10-CM

## 2012-02-10 DIAGNOSIS — Z1151 Encounter for screening for human papillomavirus (HPV): Secondary | ICD-10-CM | POA: Insufficient documentation

## 2012-02-10 DIAGNOSIS — R635 Abnormal weight gain: Secondary | ICD-10-CM | POA: Insufficient documentation

## 2012-02-10 DIAGNOSIS — F17200 Nicotine dependence, unspecified, uncomplicated: Secondary | ICD-10-CM

## 2012-02-10 DIAGNOSIS — Z23 Encounter for immunization: Secondary | ICD-10-CM

## 2012-02-10 NOTE — Patient Instructions (Addendum)

## 2012-02-10 NOTE — Progress Notes (Signed)
Maureen Wall [birth year] [id number]   History:    41 y.o.  for annual gyn exam with no complaints today. Review of patient's record indicated in the past she's had cervical dysplasia treated with CO2 laser. She's also had history of bilateral ectopic pregnancies and has had a left salpingo-oophorectomy and right salpingectomy. She also has had history of VAIN III of left labia minora where she had wide local excision with some marginal involvement. Patient been followed with yearly colposcopic evaluations as well as yearly Pap smears. Her last mammogram was October 2012. Her primary physician is Dr.Slatosky who has been monitoring her hyperlipidemia for which she's currently on a statin. She scheduled to see him the next few weeks and we'll be getting her lab work done. He has prescribed her Xanax in the past for anxiety. She does her monthly self breast examination. She is a chronic smoker of a half a pack cigarette per day  Past medical history,surgical history, family history and social history were all reviewed and documented in the EPIC chart.  Gynecologic History Patient's last menstrual period was 01/28/2012. Contraception: Bilateral salpingectomies Last Pap: 2012. Results were: normal Last mammogram: 2012. Results were: normal  Obstetric History OB History    Grav Para Term Preterm Abortions TAB SAB Ect Mult Living   3 0   3  1 2   0     # Outc Date GA Lbr Len/2nd Wgt Sex Del Anes PTL Lv   1 ECT            2 ECT            3 SAB                ROS: A ROS was performed and pertinent positives and negatives are included in the history.  GENERAL: No fevers or chills. HEENT: No change in vision, no earache, sore throat or sinus congestion. NECK: No pain or stiffness. CARDIOVASCULAR: No chest pain or pressure. No palpitations. PULMONARY: No shortness of breath, cough or wheeze. GASTROINTESTINAL: No abdominal pain, nausea, vomiting or diarrhea, melena or bright red blood per rectum.  GENITOURINARY: No urinary frequency, urgency, hesitancy or dysuria. MUSCULOSKELETAL: No joint or muscle pain, no back pain, no recent trauma. DERMATOLOGIC: No rash, no itching, no lesions. ENDOCRINE: No polyuria, polydipsia, no heat or cold intolerance. No recent change in weight. HEMATOLOGICAL: No anemia or easy bruising or bleeding. NEUROLOGIC: No headache, seizures, numbness, tingling or weakness. PSYCHIATRIC: No depression, no loss of interest in normal activity or change in sleep pattern.     Exam: chaperone present  BP 128/88  Ht 5' 2.5" (1.588 m)  Wt 183 lb (83.008 kg)  BMI 32.94 kg/m2  LMP 01/28/2012  Body mass index is 32.94 kg/(m^2).  General appearance : Well developed well nourished female. No acute distress HEENT: Neck supple, trachea midline, no carotid bruits, no thyroidmegaly Lungs: Clear to auscultation, no rhonchi or wheezes, or rib retractions  Heart: Regular rate and rhythm, no murmurs or gallops Breast:Examined in sitting and supine position were symmetrical in appearance, no palpable masses or tenderness,  no skin retraction, no nipple inversion, no nipple discharge, no skin discoloration, no axillary or supraclavicular lymphadenopathy Abdomen: no palpable masses or tenderness, no rebound or guarding Extremities: no edema or skin discoloration or tenderness  Pelvic:  Bartholin, Urethra, Skene Glands: Within normal limits             Vagina: No gross lesions or discharge  Cervix: No gross lesions  or discharge  Uterus  anteverted, normal size, shape and consistency, non-tender and mobile  Adnexa  Without masses or tenderness  Anus and perineum  normal   Rectovaginal  normal sphincter tone without palpated masses or tenderness             Hemoccult not done     Assessment/Plan:  41 y.o. female for annual exam will return back next month for colposcopic evaluation. 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 Despite the new screening guidelines for Pap smear we will continue to do Pap smears on her on a yearly basis for close surveillance. She was counseled and received a Tdap vaccine today. Her primary physician will be doing her lab work. She was reminded to schedule her mammogram to continue to do her monthly self breast examination. Once again she was counseled on the detrimental effects of smoking. She is in the process of obtaining electrical cigarette to help her quit. We discussed importance of calcium and vitamin D along with regular exercise for osteoporosis prevention.    Ok Edwards MD, 10:43 AM 02/10/2012

## 2012-02-10 NOTE — Addendum Note (Signed)
Addended by: Bertram Savin A on: 02/10/2012 03:51 PM   Modules accepted: Orders

## 2012-03-07 ENCOUNTER — Encounter: Payer: Self-pay | Admitting: Gynecology

## 2012-03-07 ENCOUNTER — Ambulatory Visit (INDEPENDENT_AMBULATORY_CARE_PROVIDER_SITE_OTHER): Payer: 59 | Admitting: Gynecology

## 2012-03-07 VITALS — BP 122/88

## 2012-03-07 DIAGNOSIS — N891 Moderate vaginal dysplasia: Secondary | ICD-10-CM

## 2012-03-07 DIAGNOSIS — N893 Dysplasia of vagina, unspecified: Secondary | ICD-10-CM

## 2012-03-07 DIAGNOSIS — D072 Carcinoma in situ of vagina: Secondary | ICD-10-CM

## 2012-03-07 DIAGNOSIS — Z87412 Personal history of vulvar dysplasia: Secondary | ICD-10-CM

## 2012-03-07 NOTE — Progress Notes (Signed)
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 No lesions were noted. Patient was reassured and she will continue to receive annual Pap smears and annual colposcopic exam.

## 2012-03-23 ENCOUNTER — Encounter: Payer: Self-pay | Admitting: Gynecology

## 2013-02-12 ENCOUNTER — Other Ambulatory Visit (HOSPITAL_COMMUNITY)
Admission: RE | Admit: 2013-02-12 | Discharge: 2013-02-12 | Disposition: A | Discharge status: Home / Self Care | Payer: 59 | Source: Ambulatory Visit | Attending: Gynecology | Admitting: Gynecology

## 2013-02-12 ENCOUNTER — Encounter: Payer: Self-pay | Admitting: Gynecology

## 2013-02-12 ENCOUNTER — Ambulatory Visit (INDEPENDENT_AMBULATORY_CARE_PROVIDER_SITE_OTHER): Payer: 59 | Admitting: Gynecology

## 2013-02-12 VITALS — BP 130/84 | Ht 61.5 in | Wt 181.0 lb

## 2013-02-12 DIAGNOSIS — Z01419 Encounter for gynecological examination (general) (routine) without abnormal findings: Secondary | ICD-10-CM

## 2013-02-12 NOTE — Progress Notes (Signed)
Maureen Wall 15-Mar-1971 [id number]   History:    42 y.o.  for annual gyn exam with no complaints today. 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 Her Tdap and flu vaccine are up-to-date. Her primary physician Dr.Slatosky who has been monitoring her hyperlipidemia for which she's currently on a statin.    Past medical history,surgical history, family history and social history were all reviewed and documented in the EPIC chart.  Gynecologic History Patient's last menstrual period was 01/19/2013. Contraception: Bilateral salpingectomies Last Pap: 2013. Results were: normal Last mammogram: 2013. Results were:  Dense but normal 3Dmammogram done  Obstetric History OB History  Gravida Para Term Preterm AB SAB TAB Ectopic Multiple Living  3 0   3 1  2   0    # Outcome Date GA Lbr Len/2nd Weight Sex Delivery Anes PTL Lv  3 ECT           2 ECT           1 SAB                ROS: A ROS was performed and pertinent positives and negatives are included in the history.  GENERAL: No fevers or chills. HEENT: No change in vision, no earache, sore throat or sinus congestion. NECK: No pain or stiffness. CARDIOVASCULAR: No chest pain or pressure. No palpitations. PULMONARY: No shortness of breath, cough or wheeze. GASTROINTESTINAL: No abdominal pain, nausea, vomiting or diarrhea, melena or bright red blood per  rectum. GENITOURINARY: No urinary frequency, urgency, hesitancy or dysuria. MUSCULOSKELETAL: No joint or muscle pain, no back pain, no recent trauma. DERMATOLOGIC: No rash, no itching, no lesions. ENDOCRINE: No polyuria, polydipsia, no heat or cold intolerance. No recent change in weight. HEMATOLOGICAL: No anemia or easy bruising or bleeding. NEUROLOGIC: No headache, seizures, numbness, tingling or weakness. PSYCHIATRIC: No depression, no loss of interest in normal activity or change in sleep pattern.     Exam: chaperone present  BP 130/84  Ht 5' 1.5" (1.562 m)  Wt 181 lb (82.101 kg)  BMI 33.65 kg/m2  LMP 01/19/2013  Body mass index is 33.65 kg/(m^2).  General appearance : Well developed well nourished female. No acute distress HEENT: Neck supple, trachea midline, no carotid bruits, no thyroidmegaly Lungs: Clear to auscultation, no rhonchi or wheezes, or rib retractions  Heart: Regular rate and rhythm, no murmurs or gallops Breast:Examined in sitting and supine position were symmetrical in appearance, no palpable masses or tenderness,  no skin retraction, no nipple inversion, no nipple discharge, no skin discoloration, no axillary or supraclavicular lymphadenopathy Abdomen: no palpable masses or tenderness, no rebound or guarding Extremities: no edema or skin discoloration or tenderness  Pelvic:  Bartholin, Urethra, Skene Glands: Within normal limits             Vagina: No gross lesions or discharge  Cervix: No gross lesions or discharge  Uterus  anteverted, normal size, shape and consistency, non-tender and mobile  Adnexa  Without masses or tenderness  Anus and perineum  normal   Rectovaginal  normal sphincter tone without palpated masses or tenderness             Hemoccult none indicated     Assessment/Plan:  42 y.o. female for annual exam with past history of vaginal and cervical dysplasia in the past. See above for details. Pap smear was done today. Magnification lens was used  to inspect her external genitalia, cervix, fornix, perineum, perirectal region with no grossly she is on inspection. Patient was minus get her mammogram. She was not to do monthly self breast examination. She was once again counseled on the detrimental effects of smoking. She could not tolerate Chantix. We discussed importance of calcium and regular exercise for osteoporosis prevention. Patient was reminded to schedule her mammogram for next month. PCP did her lab work.  Note: This dictation was prepared with  Dragon/digital dictation along withSmart phrase technology. Any transcriptional errors that result from this process are unintentional.   Ok Edwards MD, 9:30 AM 02/12/2013

## 2013-02-23 ENCOUNTER — Encounter: Payer: Self-pay | Admitting: Gynecology

## 2013-03-23 ENCOUNTER — Encounter: Payer: Self-pay | Admitting: Gynecology

## 2013-10-16 ENCOUNTER — Encounter: Payer: Self-pay | Admitting: Gynecology

## 2013-10-16 ENCOUNTER — Ambulatory Visit (INDEPENDENT_AMBULATORY_CARE_PROVIDER_SITE_OTHER): Payer: 59 | Admitting: Gynecology

## 2013-10-16 VITALS — BP 122/78

## 2013-10-16 DIAGNOSIS — N949 Unspecified condition associated with female genital organs and menstrual cycle: Secondary | ICD-10-CM

## 2013-10-16 DIAGNOSIS — N938 Other specified abnormal uterine and vaginal bleeding: Secondary | ICD-10-CM

## 2013-10-16 DIAGNOSIS — N925 Other specified irregular menstruation: Secondary | ICD-10-CM

## 2013-10-16 NOTE — Patient Instructions (Signed)
Transvaginal Ultrasound Transvaginal ultrasound is a pelvic ultrasound, using a metal probe that is placed in the vagina, to look at a women's female organs. Transvaginal ultrasound is a method of seeing inside the pelvis of a woman. The ultrasound machine sends out sound waves from the transducer (probe). These sound waves bounce off body structures (like an echo) to create a picture. The picture shows up on a monitor. It is called transvaginal because the probe is inserted into the vagina. There should be very little discomfort from the vaginal probe. This test can also be used during pregnancy. Endovaginal ultrasound is another name for a transvaginal ultrasound. In a transabdominal ultrasound, the probe is placed on the outside of the belly. This method gives pictures that are lower quality than pictures from the transvaginal technique. Transvaginal ultrasound is used to look for problems of the female genital tract. Some such problems include:  Infertility problems.  Congenital (birth defect) malformations of the uterus and ovaries.  Tumors in the uterus.  Abnormal bleeding.  Ovarian tumors and cysts.  Abscess (inflamed tissue around pus) in the pelvis.  Unexplained abdominal or pelvic pain.  Pelvic infection. DURING PREGNANCY, TRANSVAGINAL ULTRASOUND MAY BE USED TO LOOK AT:  Normal pregnancy.  Ectopic pregnancy (pregnancy outside the uterus).  Fetal heartbeat.  Abnormalities in the pelvis, that are not seen well with transabdominal ultrasound.  Suspected twins or multiples.  Impending miscarriage.  Problems with the cervix (incompetent cervix, not able to stay closed and hold the baby).  When doing an amniocentesis (removing fluid from the pregnancy sac, for testing).  Looking for abnormalities of the baby.  Checking the growth, development, and age of the fetus.  Measuring the amount of fluid in the amniotic sac.  When doing an external version of the baby (moving  baby into correct position).  Evaluating the baby for problems in high risk pregnancies (biophysical profile).  Suspected fetal demise (death). Sometimes a special ultrasound method called Saline Infusion Sonography (SIS) is used for a more accurate look at the uterus. Sterile saline (salt water) is injected into the uterus of non-pregnant patients to see the inside of the uterus better. SIS is not used on pregnant women. The vaginal probe can also assist in obtaining biopsies of abnormal areas, in draining fluid from cysts on the ovary, and in finding IUDs (intrauterine device, birth control) that cannot be located. PREPARATION FOR TEST A transvaginal ultrasound is done with the bladder empty. The transabdominal ultrasound is done with your bladder full. You may be asked to drink several glasses of water before that exam. Sometimes, a transabdominal ultrasound is done just after a transvaginal ultrasound, to look at organs in your abdomen. PROCEDURE  You will lie down on a table, with your knees bent and your feet in foot holders. The probe is covered with a condom. A sterile lubricant is put into the vagina and on the probe. The lubricant helps transmit the sound waves and avoid irritating the vagina. Your caregiver will move the probe inside the vaginal cavity to scan the pelvic structures. A normal test will show a normal pelvis and normal contents. An abnormal test will show abnormalities of the pelvis, placenta, or baby. ABNORMAL RESULTS MAY BE DUE TO:  Growths or tumors in the:  Uterus.  Ovaries.  Vagina.  Other pelvic structures.  Non-cancerous growths of the uterus and ovaries.  Twisting of the ovary, cutting off blood supply to the ovary (ovarian torsion).  Areas of infection, including:  Pelvic  inflammatory disease.  Abscess in the pelvis.  Locating an IUD. PROBLEMS FOUND IN PREGNANT WOMEN MAY INCLUDE:  Ectopic pregnancy (pregnancy outside the uterus).  Multiple  pregnancies.  Early dilation (opening) of the cervix. This may indicate an incompetent cervix and early delivery.  Impending miscarriage.  Fetal death.  Problems with the placenta, including:  Placenta has grown over the opening of the womb (placenta previa).  Placenta has separated early in the womb (placental abruption).  Placenta grows into the muscle of the uterus (placenta accreta).  Tumors of pregnancy, including gestational trophoblastic disease. This is an abnormal pregnancy, with no fetus. The uterus is filled with many grape-like cysts that could sometimes be cancerous.  Incorrect position of the fetus (breech, vertex).  Intrauterine fetal growth retardation (IUGR) (poor growth in the womb).  Fetal abnormalities or infection. RISKS AND COMPLICATIONS There are no known risks to the ultrasound procedure. There is no X-ray used when doing an ultrasound. Document Released: 03/10/2004 Document Revised: 06/21/2011 Document Reviewed: 02/26/2009 [facility] Patient Information 2015 [place], [place]. This information is not intended to replace advice given to you by your health care provider. Make sure you discuss any questions you have with your health care provider. Endometrial Biopsy Endometrial biopsy is a procedure in which a tissue sample is taken from inside the uterus. The tissue sample is then looked at under a microscope to see if the tissue is normal or abnormal. The endometrium is the lining of the uterus. This procedure helps determine where you are in your menstrual cycle and how hormone levels are affecting the lining of the uterus. This procedure may also be used to evaluate uterine bleeding or to diagnose endometrial cancer, tuberculosis, polyps, or inflammatory conditions.  LET [facility] CARE PROVIDER KNOW ABOUT:  Any allergies you have.  All medicines you are taking, including vitamins, herbs, eye drops, creams, and over-the-counter medicines.  Previous problems  you or members of your family have had with the use of anesthetics.  Any blood disorders you have.  Previous surgeries you have had.  Medical conditions you have.  Possibility of pregnancy. RISKS AND COMPLICATIONS Generally, this is a safe procedure. However, as with any procedure, complications can occur. Possible complications include:  Bleeding.  Pelvic infection.  Puncture of the uterine wall with the biopsy device (rare). BEFORE THE PROCEDURE   Keep a record of your menstrual cycles as directed by your health care provider. You may need to schedule your procedure for a specific time in your cycle.  You may want to bring a sanitary pad to wear home after the procedure.  Arrange for someone to drive you home after the procedure if you will be given a medicine to help you relax (sedative). PROCEDURE   You may be given a sedative to relax you.  You will lie on an exam table with your feet and legs supported as in a pelvic exam.  Your health care provider will insert an instrument (speculum) into your vagina to see your cervix.  Your cervix will be cleansed with an antiseptic solution. A medicine (local anesthetic) will be used to numb the cervix.  A forceps instrument (tenaculum) will be used to hold your cervix steady for the biopsy.  A thin, rodlike instrument (uterine sound) will be inserted through your cervix to determine the length of your uterus and the location where the biopsy sample will be removed.  A thin, flexible tube (catheter) will be inserted through your cervix and into the uterus. The  catheter is used to collect the biopsy sample from your endometrial tissue.  The catheter and speculum will then be removed, and the tissue sample will be sent to a lab for examination. AFTER THE PROCEDURE  You will rest in a recovery area until you are ready to go home.  You may have mild cramping and a small amount of vaginal bleeding for a few days after the  procedure. This is normal.  Make sure you find out how to get your test results. Document Released: 07/30/2004 Document Revised: 11/29/2012 Document Reviewed: 09/13/2012 [facility] Patient Information 2015 [place], [place]. This information is not intended to replace advice given to you by your health care provider. Make sure you discuss any questions you have with your health care provider.  Perimenopause Perimenopause is the time when your body begins to move into the menopause (no menstrual period for 12 straight months). It is a natural process. Perimenopause can begin 2-8 years before the menopause and usually lasts for 1 year after the menopause. During this time, your ovaries may or may not produce an egg. The ovaries vary in their production of estrogen and progesterone hormones each month. This can cause irregular menstrual periods, difficulty getting pregnant, vaginal bleeding between periods, and uncomfortable symptoms. CAUSES  Irregular production of the ovarian hormones, estrogen and progesterone, and not ovulating every month.  Other causes include:  Tumor of the pituitary gland in the brain.  Medical disease that affects the ovaries.  Radiation treatment.  Chemotherapy.  Unknown causes.  Heavy smoking and excessive alcohol intake can bring on perimenopause sooner. SIGNS AND SYMPTOMS   Hot flashes.  Night sweats.  Irregular menstrual periods.  Decreased sex drive.  Vaginal dryness.  Headaches.  Mood swings.  Depression.  Memory problems.  Irritability.  Tiredness.  Weight gain.  Trouble getting pregnant.  The beginning of losing bone cells (osteoporosis).  The beginning of hardening of the arteries (atherosclerosis). DIAGNOSIS  Your health care provider will make a diagnosis by analyzing your age, menstrual history, and symptoms. He or she will do a physical exam and note any changes in your body, especially your female organs. Female hormone tests  may or may not be helpful depending on the amount of female hormones you produce and when you produce them. However, other hormone tests may be helpful to rule out other problems. TREATMENT  In some cases, no treatment is needed. The decision on whether treatment is necessary during the perimenopause should be made by you and your health care provider based on how the symptoms are affecting you and your lifestyle. Various treatments are available, such as:  Treating individual symptoms with a specific medicine for that symptom.  Herbal medicines that can help specific symptoms.  Counseling.  Group therapy. HOME CARE INSTRUCTIONS   Keep track of your menstrual periods (when they occur, how heavy they are, how long between periods, and how long they last) as well as your symptoms and when they started.  Only take over-the-counter or prescription medicines as directed by your health care provider.  Sleep and rest.  Exercise.  Eat a diet that contains calcium (good for your bones) and soy (acts like the estrogen hormone).  Do not smoke.  Avoid alcoholic beverages.  Take vitamin supplements as recommended by your health care provider. Taking vitamin E may help in certain cases.  Take calcium and vitamin D supplements to help prevent bone loss.  Group therapy is sometimes helpful.  Acupuncture may help in some cases.  SEEK MEDICAL CARE IF:   You have questions about any symptoms you are having.  You need a referral to a specialist (gynecologist, psychiatrist, or psychologist). SEEK IMMEDIATE MEDICAL CARE IF:   You have vaginal bleeding.  Your period lasts longer than 8 days.  Your periods are recurring sooner than 21 days.  You have bleeding after intercourse.  You have severe depression.  You have pain when you urinate.  You have severe headaches.  You have vision problems. Document Released: 05/06/2004 Document Revised: 01/17/2013 Document Reviewed:  10/26/2012 [facility] Patient Information 2015 [place], [place]. This information is not intended to replace advice given to you by your health care provider. Make sure you discuss any questions you have with your health care provider.

## 2013-10-16 NOTE — Progress Notes (Signed)
   Patient is a 43 year old who presented to the office stating that her menstrual cycles were normal until May of this year. She has experienced 3 bleeding episodes in the month of June (June 3, June 12, in June 24 for 2-3 days. Patient denies any visual disturbances or any unusual headaches or nipple discharge. She reports no weight changes and no new medication. She otherwise has been doing well. She does have history of bilateral salpingectomy in the past as a result of ectopic pregnancy.  Exam: Bartholin urethra Skene was within normal limits Vagina: No lesions or discharge Cervix: No lesions or discharge Uterus: Upper limits of normal anteverted Adnexa: No palpable mass or tenderness  Patient was counseled for endometrial biopsy. She had a normal Pap smear 7 months ago. The cervix required minimal dilatation to facilitate insertion of the [place] after the cervix was cleansed with Betadine solution. The tissue that was obtained was submitted for histological evaluation  Assessment/plan: 43 year old with dysfunction uterine bleeding endometrial biopsy done today results pending at time of this dictation. We will check today TSH, prolactin and FSH and she will return back next week for some infusion histogram to complete the evaluation for dysfunctional uterine bleeding to rule out any intrauterine abnormality such as a submucous myoma or endometrial polyp.

## 2013-10-17 LAB — FOLLICLE STIMULATING HORMONE: FSH: 2.8 m[IU]/mL

## 2013-10-17 LAB — PROLACTIN: Prolactin: 13.2 ng/mL

## 2013-10-17 LAB — TSH: TSH: 4.809 u[IU]/mL — AB (ref 0.350–4.500)

## 2013-10-19 ENCOUNTER — Other Ambulatory Visit: Payer: Self-pay | Admitting: Gynecology

## 2013-10-19 DIAGNOSIS — R7989 Other specified abnormal findings of blood chemistry: Secondary | ICD-10-CM

## 2013-10-26 ENCOUNTER — Other Ambulatory Visit: Payer: Self-pay | Admitting: Gynecology

## 2013-10-26 DIAGNOSIS — N938 Other specified abnormal uterine and vaginal bleeding: Secondary | ICD-10-CM

## 2013-11-02 ENCOUNTER — Ambulatory Visit (INDEPENDENT_AMBULATORY_CARE_PROVIDER_SITE_OTHER): Payer: 59

## 2013-11-02 ENCOUNTER — Ambulatory Visit (INDEPENDENT_AMBULATORY_CARE_PROVIDER_SITE_OTHER): Payer: 59 | Admitting: Gynecology

## 2013-11-02 DIAGNOSIS — N926 Irregular menstruation, unspecified: Secondary | ICD-10-CM

## 2013-11-02 DIAGNOSIS — N83209 Unspecified ovarian cyst, unspecified side: Secondary | ICD-10-CM

## 2013-11-02 DIAGNOSIS — N949 Unspecified condition associated with female genital organs and menstrual cycle: Secondary | ICD-10-CM

## 2013-11-02 DIAGNOSIS — N938 Other specified abnormal uterine and vaginal bleeding: Secondary | ICD-10-CM

## 2013-11-02 DIAGNOSIS — N83201 Unspecified ovarian cyst, right side: Secondary | ICD-10-CM | POA: Insufficient documentation

## 2013-11-02 DIAGNOSIS — R7989 Other specified abnormal findings of blood chemistry: Secondary | ICD-10-CM

## 2013-11-02 DIAGNOSIS — R946 Abnormal results of thyroid function studies: Secondary | ICD-10-CM

## 2013-11-02 NOTE — Progress Notes (Signed)
   Patient presented to the office today as a continuation of her workup for her dysfunctional uterine bleeding. She was seen in the office on July 7 stating that her menstrual cycles were normal until May of this year. She has experienced 3 bleeding episodes in the month of June (June 3, June 12, in June 24 for 2-3 days. Patient denies any visual disturbances or any unusual headaches or nipple discharge. She reports no weight changes and no new medication. She otherwise has been doing well. She does have history of bilateral salpingectomy in the past as a result of ectopic pregnancy.  She had an endometrial biopsy which demonstrated the following: Diagnosis Endometrium, biopsy, uterus - SECRETORY ENDOMETRIUM. - METAPLASTIC SQUAMOUS EPITHELIUM. - NO HYPERPLASIA OR MALIGNANCY.  She also had an [place] and prolactin which were in the normal range but H. was found to be elevated at 4.809. We repeated her thyroid function tests and results are pending.  Ultrasound/sono histogram today: Uterus measured 8.8 x 5.0 x 3.7 cm with endometrial stripe of a 20 mm. Left ovary was normal. Right ovarian ankle free thin wall vascular cyst measuring 23 x 19 mm possible corpus luteum cyst. The cervix was then cleansed with Betadine solution a sterile catheter was introduced into the uterine cavity and normal saline was instilled. There was no intracavitary defect noted.  Assessment/plan: Patient with irregular cycle in the month of June and now in July with no abnormality noted on sonohysterogram as well as normal TSH and prolactin. TSH was elevated repeated today to confirm that she does have hypothyroidism which may explain her irregular bleeding. She will be placed me while on Prometrium 200 mg one by mouth daily for 12 days of each month for the next 3 months. She will return back to the office in 3 months for a followup ultrasound. Will await the results of the thyroid function test. I provided her with literature  information on hypothyroidism as well as ovarian cysts and Synthroid

## 2013-11-02 NOTE — Patient Instructions (Signed)
Please take the Prometrium 200 mg one tablet before bedtime for the first 12 days of the month for the next 3 months  We will need a followup ultrasound on the small cysts in 3 months  We will let you know the results of the thyroid function test next week. Meanwhile please read the following information on ovarian cysts and on hypothyroidism  Hypothyroidism The thyroid is a large gland located in the lower front of your neck. The thyroid gland helps control metabolism. Metabolism is how your body handles food. It controls metabolism with the hormone thyroxine. When this gland is underactive (hypothyroid), it produces too little hormone.  CAUSES These include:   Absence or destruction of thyroid tissue.  Goiter due to iodine deficiency.  Goiter due to medications.  Congenital defects (since birth).  Problems with the pituitary. This causes a lack of TSH (thyroid stimulating hormone). This hormone tells the thyroid to turn out more hormone. SYMPTOMS  Lethargy (feeling as though you have no energy)  Cold intolerance  Weight gain (in spite of normal food intake)  Dry skin  Coarse hair  Menstrual irregularity (if severe, may lead to infertility)  Slowing of thought processes Cardiac problems are also caused by insufficient amounts of thyroid hormone. Hypothyroidism in the newborn is cretinism, and is an extreme form. It is important that this form be treated adequately and immediately or it will lead rapidly to retarded physical and mental development. DIAGNOSIS  To prove hypothyroidism, your caregiver may do blood tests and ultrasound tests. Sometimes the signs are hidden. It may be necessary for your caregiver to watch this illness with blood tests either before or after diagnosis and treatment. TREATMENT  Low levels of thyroid hormone are increased by using synthetic thyroid hormone. This is a safe, effective treatment. It usually takes about four weeks to gain the full effects  of the medication. After you have the full effect of the medication, it will generally take another four weeks for problems to leave. Your caregiver may start you on low doses. If you have had heart problems the dose may be gradually increased. It is generally not an emergency to get rapidly to normal. HOME CARE INSTRUCTIONS   Take your medications as your caregiver suggests. Let your caregiver know of any medications you are taking or start taking. Your caregiver will help you with dosage schedules.  As your condition improves, your dosage needs may increase. It will be necessary to have continuing blood tests as suggested by your caregiver.  Report all suspected medication side effects to your caregiver. SEEK MEDICAL CARE IF: Seek medical care if you develop:  Sweating.  Tremulousness (tremors).  Anxiety.  Rapid weight loss.  Heat intolerance.  Emotional swings.  Diarrhea.  Weakness. SEEK IMMEDIATE MEDICAL CARE IF:  You develop chest pain, an irregular heart beat (palpitations), or a rapid heart beat. MAKE SURE YOU:   Understand these instructions.  Will watch your condition.  Will get help right away if you are not doing well or get worse. Document Released: 03/29/2005 Document Revised: 06/21/2011 Document Reviewed: 11/17/2007 [facility] Patient Information 2015 [place], [place]. This information is not intended to replace advice given to you by your health care provider. Make sure you discuss any questions you have with your health care provider.  Levothyroxine tablets What is this medicine? LEVOTHYROXINE (lee voe thye ROX een) is a thyroid hormone. This medicine can improve symptoms of thyroid deficiency such as slow speech, lack of energy, weight gain, hair  loss, dry skin, and feeling cold. It also helps to treat goiter (an enlarged thyroid gland). It is also used to treat some kinds of thyroid cancer along with surgery and other medicines. This medicine may be used for  other purposes; ask your health care provider or pharmacist if you have questions. COMMON BRAND NAME(S): Estre, Levo-T, Levothroid, Levoxyl, Synthroid, Thyro-Tabs, Unithroid What should I tell my health care provider before I take this medicine? They need to know if you have any of these conditions: -angina -blood clotting problems -diabetes -dieting or on a weight loss program -fertility problems -heart disease -high levels of thyroid hormone -pituitary gland problem -previous heart attack -an unusual or allergic reaction to levothyroxine, thyroid hormones, other medicines, foods, dyes, or preservatives -pregnant or trying to get pregnant -breast-feeding How should I use this medicine? Take this medicine by mouth with plenty of water. It is best to take on an empty stomach, at least 30 minutes before or 2 hours after food. Follow the directions on the prescription label. Take at the same time each day. Do not take your medicine more often than directed. Contact your pediatrician regarding the use of this medicine in children. While this drug may be prescribed for children and infants as young as a few days of age for selected conditions, precautions do apply. For infants, you may crush the tablet and place in a small amount of (5-10 ml or 1 to 2 teaspoonfuls) of water, breast milk, or non-soy based infant formula. Do not mix with soy-based infant formula. Give as directed. Overdosage: If you think you have taken too much of this medicine contact a poison control center or emergency room at once. NOTE: This medicine is only for you. Do not share this medicine with others. What if I miss a dose? If you miss a dose, take it as soon as you can. If it is almost time for your next dose, take only that dose. Do not take double or extra doses. What may interact with this medicine? -amiodarone -antacids -anti-thyroid medicines -calcium  supplements -carbamazepine -cholestyramine -colestipol -digoxin -female hormones, including contraceptive or birth control pills -iron supplements -ketamine -liquid nutrition products like Ensure -medicines for colds and breathing difficulties -medicines for diabetes -medicines for mental depression -medicines or herbals used to decrease weight or appetite -phenobarbital or other barbiturate medications -phenytoin -prednisone or other corticosteroids -rifabutin -rifampin -soy isoflavones -sucralfate -theophylline -warfarin This list may not describe all possible interactions. Give your health care provider a list of all the medicines, herbs, non-prescription drugs, or dietary supplements you use. Also tell them if you smoke, drink alcohol, or use illegal drugs. Some items may interact with your medicine. What should I watch for while using this medicine? Be sure to take this medicine with plenty of fluids. Some tablets may cause choking, gagging, or difficulty swallowing from the tablet getting stuck in your throat. Most of these problems disappear if the medicine is taken with the right amount of water or other fluids. Do not switch brands of this medicine unless your health care professional agrees with the change. Ask questions if you are uncertain. You will need regular exams and occasional blood tests to check the response to treatment. If you are receiving this medicine for an underactive thyroid, it may be several weeks before you notice an improvement. Check with your doctor or health care professional if your symptoms do not improve. It may be necessary for you to take this medicine for the rest of  your life. Do not stop using this medicine unless your doctor or health care professional advises you to. This medicine can affect blood sugar levels. If you have diabetes, check your blood sugar as directed. You may lose some of your hair when you first start treatment. With time,  this usually corrects itself. If you are going to have surgery, tell your doctor or health care professional that you are taking this medicine. What side effects may I notice from receiving this medicine? Side effects that you should report to your doctor or health care professional as soon as possible: -allergic reactions like skin rash, itching or hives, swelling of the face, lips, or tongue -chest pain -excessive sweating or intolerance to heat -fast or irregular heartbeat -nervousness -skin rash or hives -swelling of ankles, feet, or legs -tremors Side effects that usually do not require medical attention (report to your doctor or health care professional if they continue or are bothersome): -changes in appetite -changes in menstrual periods -diarrhea -hair loss -headache -trouble sleeping -weight loss This list may not describe all possible side effects. Call your doctor for medical advice about side effects. You may report side effects to FDA at 1-800-FDA-1088. Where should I keep my medicine? Keep out of the reach of children. Store at room temperature between 15 and 30 degrees C (59 and 86 degrees F). Protect from light and moisture. Keep container tightly closed. Throw away any unused medicine after the expiration date. NOTE: This sheet is a summary. It may not cover all possible information. If you have questions about this medicine, talk to your doctor, pharmacist, or health care provider.  2015, Elsevier/[employer]. (2008-07-05 14:28:07) Ovarian Cyst An ovarian cyst is a fluid-filled sac that forms on an ovary. The ovaries are small organs that produce eggs in women. Various types of cysts can form on the ovaries. Most are not cancerous. Many do not cause problems, and they often go away on their own. Some may cause symptoms and require treatment. Common types of ovarian cysts include:  Functional cysts--These cysts may occur every month during the menstrual cycle. This  is normal. The cysts usually go away with the next menstrual cycle if the woman does not get pregnant. Usually, there are no symptoms with a functional cyst.  Endometrioma cysts--These cysts form from the tissue that lines the uterus. They are also called "chocolate cysts" because they become filled with blood that turns brown. This type of cyst can cause pain in the lower abdomen during intercourse and with your menstrual period.  Cystadenoma cysts--This type develops from the cells on the outside of the ovary. These cysts can get very big and cause lower abdomen pain and pain with intercourse. This type of cyst can twist on itself, cut off its blood supply, and cause severe pain. It can also easily rupture and cause a lot of pain.  Dermoid cysts--This type of cyst is sometimes found in both ovaries. These cysts may contain different kinds of body tissue, such as skin, teeth, hair, or cartilage. They usually do not cause symptoms unless they get very big.  Theca lutein cysts--These cysts occur when too much of a certain hormone (human chorionic gonadotropin) is produced and overstimulates the ovaries to produce an egg. This is most common after procedures used to assist with the conception of a baby (in vitro fertilization). CAUSES   Fertility drugs can cause a condition in which multiple large cysts are formed on the ovaries. This is called ovarian hyperstimulation  syndrome.  A condition called polycystic ovary syndrome can cause hormonal imbalances that can lead to nonfunctional ovarian cysts. SIGNS AND SYMPTOMS  Many ovarian cysts do not cause symptoms. If symptoms are present, they may include:  Pelvic pain or pressure.  Pain in the lower abdomen.  Pain during sexual intercourse.  Increasing girth (swelling) of the abdomen.  Abnormal menstrual periods.  Increasing pain with menstrual periods.  Stopping having menstrual periods without being pregnant. DIAGNOSIS  These cysts are  commonly found during a routine or annual pelvic exam. Tests may be ordered to find out more about the cyst. These tests may include:  Ultrasound.  X-ray of the pelvis.  CT scan.  MRI.  Blood tests. TREATMENT  Many ovarian cysts go away on their own without treatment. Your health care provider may want to check your cyst regularly for 2-3 months to see if it changes. For women in menopause, it is particularly important to monitor a cyst closely because of the higher rate of ovarian cancer in menopausal women. When treatment is needed, it may include any of the following:  A procedure to drain the cyst (aspiration). This may be done using a long needle and ultrasound. It can also be done through a laparoscopic procedure. This involves using a thin, lighted tube with a tiny camera on the end (laparoscope) inserted through a small incision.  Surgery to remove the whole cyst. This may be done using laparoscopic surgery or an open surgery involving a larger incision in the lower abdomen.  Hormone treatment or birth control pills. These methods are sometimes used to help dissolve a cyst. HOME CARE INSTRUCTIONS   Only take over-the-counter or prescription medicines as directed by your health care provider.  Follow up with your health care provider as directed.  Get regular pelvic exams and Pap tests. SEEK MEDICAL CARE IF:   Your periods are late, irregular, or painful, or they stop.  Your pelvic pain or abdominal pain does not go away.  Your abdomen becomes larger or swollen.  You have pressure on your bladder or trouble emptying your bladder completely.  You have pain during sexual intercourse.  You have feelings of fullness, pressure, or discomfort in your stomach.  You lose weight for no apparent reason.  You feel generally ill.  You become constipated.  You lose your appetite.  You develop acne.  You have an increase in body and facial hair.  You are gaining weight,  without changing your exercise and eating habits.  You think you are pregnant. SEEK IMMEDIATE MEDICAL CARE IF:   You have increasing abdominal pain.  You feel sick to your stomach (nauseous), and you throw up (vomit).  You develop a fever that comes on suddenly.  You have abdominal pain during a bowel movement.  Your menstrual periods become heavier than usual. MAKE SURE YOU:  Understand these instructions.  Will watch your condition.  Will get help right away if you are not doing well or get worse. Document Released: 03/29/2005 Document Revised: 04/03/2013 Document Reviewed: 12/04/2012 [facility] Patient Information 2015 [place], [place]. This information is not intended to replace advice given to you by your health care provider. Make sure you discuss any questions you have with your health care provider.

## 2013-11-03 LAB — THYROID PANEL WITH TSH
FREE THYROXINE INDEX: 2.8 (ref 1.0–3.9)
T3 Uptake: 35.3 % (ref 22.5–37.0)
T4 TOTAL: 7.9 ug/dL (ref 5.0–12.5)
TSH: 2.485 u[IU]/mL (ref 0.350–4.500)

## 2014-02-11 ENCOUNTER — Encounter: Payer: Self-pay | Admitting: Gynecology

## 2014-04-08 ENCOUNTER — Encounter: Payer: Self-pay | Admitting: Gynecology

## 2015-03-10 ENCOUNTER — Ambulatory Visit (INDEPENDENT_AMBULATORY_CARE_PROVIDER_SITE_OTHER): Payer: 59 | Admitting: Women's Health

## 2015-03-10 ENCOUNTER — Encounter: Payer: Self-pay | Admitting: Women's Health

## 2015-03-10 VITALS — BP 126/80 | Ht 61.0 in | Wt 181.0 lb

## 2015-03-10 DIAGNOSIS — N611 Abscess of the breast and nipple: Secondary | ICD-10-CM

## 2015-03-10 DIAGNOSIS — L739 Follicular disorder, unspecified: Secondary | ICD-10-CM

## 2015-03-10 MED ORDER — CEPHALEXIN 500 MG PO CAPS: 500.0000 mg | ORAL_CAPSULE | Freq: Three times a day (TID) | ORAL | Status: DC

## 2015-03-10 NOTE — Progress Notes (Signed)
Patient ID: Maureen Wall, female   DOB: [birth year], 44 y.o.   MRN: [id number] Presents with several day history of bump on outer aspect of left breast. Slight tenderness, no reported injury. Normal mammogram history. Denies fever, nipple discharge. Normal 3-D mammogram 04/02/2014.  Exam: Appears well. Breast exam in sitting, lying position, outer aspect of left breast 2 .5 centimeter erythematous nonindurated firm superficial nodule, mild tenderness.  Breast nodule/folliculitis  Plan: Keflex 500 3 times a day for 7 days. Warm compresses. Has scheduled annual exam with Dr. Toney Rakes next week, instructed to keep scheduled exam, call in 5 days if no relief.

## 2015-03-10 NOTE — Patient Instructions (Signed)
Folliculitis °Folliculitis is redness, soreness, and swelling (inflammation) of the hair follicles. This condition can occur anywhere on the body. People with weakened immune systems, diabetes, or obesity have a greater risk of getting folliculitis. °CAUSES °· Bacterial infection. This is the most common cause. °· Fungal infection. °· Viral infection. °· Contact with certain chemicals, especially oils and tars. °Long-term folliculitis can result from bacteria that live in the nostrils. The bacteria may trigger multiple outbreaks of folliculitis over time. °SYMPTOMS °Folliculitis most commonly occurs on the scalp, thighs, legs, back, buttocks, and areas where hair is shaved frequently. An early sign of folliculitis is a small, white or yellow, pus-filled, itchy lesion (pustule). These lesions appear on a red, inflamed follicle. They are usually less than 0.2 inches (5 mm) wide. When there is an infection of the follicle that goes deeper, it becomes a boil or furuncle. A group of closely packed boils creates a larger lesion (carbuncle). Carbuncles tend to occur in hairy, sweaty areas of the body. °DIAGNOSIS  °Your caregiver can usually tell what is wrong by doing a physical exam. A sample may be taken from one of the lesions and tested in a lab. This can help determine what is causing your folliculitis. °TREATMENT  °Treatment may include: °· Applying warm compresses to the affected areas. °· Taking antibiotic medicines orally or applying them to the skin. °· Draining the lesions if they contain a large amount of pus or fluid. °· Laser hair removal for cases of long-lasting folliculitis. This helps to prevent regrowth of the hair. °HOME CARE INSTRUCTIONS °· Apply warm compresses to the affected areas as directed by your caregiver. °· If antibiotics are prescribed, take them as directed. Finish them even if you start to feel better. °· You may take over-the-counter medicines to relieve itching. °· Do not shave irritated  skin. °· Follow up with your caregiver as directed. °SEEK IMMEDIATE MEDICAL CARE IF:  °· You have increasing redness, swelling, or pain in the affected area. °· You have a fever. °MAKE SURE YOU: °· Understand these instructions. °· Will watch your condition. °· Will get help right away if you are not doing well or get worse. °  °This information is not intended to replace advice given to you by your health care provider. Make sure you discuss any questions you have with your health care provider. °  °Document Released: 06/07/2001 Document Revised: 04/19/2014 Document Reviewed: 06/29/2011 °Elsevier Interactive Patient Education ©2016 Elsevier Inc. ° °

## 2015-03-20 ENCOUNTER — Other Ambulatory Visit (HOSPITAL_COMMUNITY)
Admission: RE | Admit: 2015-03-20 | Discharge: 2015-03-20 | Disposition: A | Discharge status: Home / Self Care | Payer: 59 | Source: Ambulatory Visit | Attending: Gynecology | Admitting: Gynecology

## 2015-03-20 ENCOUNTER — Ambulatory Visit (INDEPENDENT_AMBULATORY_CARE_PROVIDER_SITE_OTHER): Payer: 59 | Admitting: Gynecology

## 2015-03-20 ENCOUNTER — Encounter: Payer: Self-pay | Admitting: Gynecology

## 2015-03-20 VITALS — BP 130/84 | Ht 62.25 in | Wt 155.0 lb

## 2015-03-20 DIAGNOSIS — Z01419 Encounter for gynecological examination (general) (routine) without abnormal findings: Secondary | ICD-10-CM | POA: Insufficient documentation

## 2015-03-20 DIAGNOSIS — D071 Carcinoma in situ of vulva: Secondary | ICD-10-CM | POA: Insufficient documentation

## 2015-03-20 DIAGNOSIS — F17210 Nicotine dependence, cigarettes, uncomplicated: Secondary | ICD-10-CM

## 2015-03-20 DIAGNOSIS — N891 Moderate vaginal dysplasia: Secondary | ICD-10-CM | POA: Diagnosis not present

## 2015-03-20 DIAGNOSIS — Z1151 Encounter for screening for human papillomavirus (HPV): Secondary | ICD-10-CM | POA: Insufficient documentation

## 2015-03-20 DIAGNOSIS — L739 Follicular disorder, unspecified: Secondary | ICD-10-CM | POA: Diagnosis not present

## 2015-03-20 NOTE — Patient Instructions (Signed)
Smoking Cessation, Tips for Success If you are ready to quit smoking, congratulations! You have chosen to help yourself be healthier. Cigarettes bring nicotine, tar, carbon monoxide, and other irritants into your body. Your lungs, heart, and blood vessels will be able to work better without these poisons. There are many different ways to quit smoking. Nicotine gum, nicotine patches, a nicotine inhaler, or nicotine nasal spray can help with physical craving. Hypnosis, support groups, and medicines help break the habit of smoking. WHAT THINGS CAN I DO TO MAKE QUITTING EASIER?  Here are some tips to help you quit for good:  Pick a date when you will quit smoking completely. Tell all of your friends and family about your plan to quit on that date.  Do not try to slowly cut down on the number of cigarettes you are smoking. Pick a quit date and quit smoking completely starting on that day.  Throw away all cigarettes.   Clean and remove all ashtrays from your home, work, and car.  On a card, write down your reasons for quitting. Carry the card with you and read it when you get the urge to smoke.  Cleanse your body of nicotine. Drink enough water and fluids to keep your urine clear or pale yellow. Do this after quitting to flush the nicotine from your body.  Learn to predict your moods. Do not let a bad situation be your excuse to have a cigarette. Some situations in your life might tempt you into wanting a cigarette.  Never have "just one" cigarette. It leads to wanting another and another. Remind yourself of your decision to quit.  Change habits associated with smoking. If you smoked while driving or when feeling stressed, try other activities to replace smoking. Stand up when drinking your coffee. Brush your teeth after eating. Sit in a different chair when you read the paper. Avoid alcohol while trying to quit, and try to drink fewer caffeinated beverages. Alcohol and caffeine may urge you to  smoke.  Avoid foods and drinks that can trigger a desire to smoke, such as sugary or spicy foods and alcohol.  Ask people who smoke not to smoke around you.  Have something planned to do right after eating or having a cup of coffee. For example, plan to take a walk or exercise.  Try a relaxation exercise to calm you down and decrease your stress. Remember, you may be tense and nervous for the first 2 weeks after you quit, but this will pass.  Find new activities to keep your hands busy. Play with a pen, coin, or rubber band. Doodle or draw things on paper.  Brush your teeth right after eating. This will help cut down on the craving for the taste of tobacco after meals. You can also try mouthwash.   Use oral substitutes in place of cigarettes. Try using lemon drops, carrots, cinnamon sticks, or chewing gum. Keep them handy so they are available when you have the urge to smoke.  When you have the urge to smoke, try deep breathing.  Designate your home as a nonsmoking area.  If you are a heavy smoker, ask your health care provider about a prescription for nicotine chewing gum. It can ease your withdrawal from nicotine.  Reward yourself. Set aside the cigarette money you save and buy yourself something nice.  Look for support from others. Join a support group or smoking cessation program. Ask someone at home or at work to help you with your plan   to quit smoking.  Always ask yourself, "Do I need this cigarette or is this just a reflex?" Tell yourself, "Today, I choose not to smoke," or "I do not want to smoke." You are reminding yourself of your decision to quit.  Do not replace cigarette smoking with electronic cigarettes (commonly called e-cigarettes). The safety of e-cigarettes is unknown, and some may contain harmful chemicals.  If you relapse, do not give up! Plan ahead and think about what you will do the next time you get the urge to smoke. HOW WILL I FEEL WHEN I QUIT SMOKING? You  may have symptoms of withdrawal because your body is used to nicotine (the addictive substance in cigarettes). You may crave cigarettes, be irritable, feel very hungry, cough often, get headaches, or have difficulty concentrating. The withdrawal symptoms are only temporary. They are strongest when you first quit but will go away within 10-14 days. When withdrawal symptoms occur, stay in control. Think about your reasons for quitting. Remind yourself that these are signs that your body is healing and getting used to being without cigarettes. Remember that withdrawal symptoms are easier to treat than the major diseases that smoking can cause.  Even after the withdrawal is over, expect periodic urges to smoke. However, these cravings are generally short lived and will go away whether you smoke or not. Do not smoke! WHAT RESOURCES ARE AVAILABLE TO HELP ME QUIT SMOKING? Your health care provider can direct you to community resources or hospitals for support, which may include:  Group support.  Education.  Hypnosis.  Therapy.   This information is not intended to replace advice given to you by your health care provider. Make sure you discuss any questions you have with your health care provider.   Document Released: 12/26/2003 Document Revised: 04/19/2014 Document Reviewed: 09/14/2012 Elsevier Interactive Patient Education 2016 [employer]vier Inc.  

## 2015-03-20 NOTE — Progress Notes (Signed)
Maureen Wall [birth year] [id number]   History:    44 y.o.  for annual gyn exam with no complaints today. A few days ago she was treated for a folliculitis of her left breast and she states that it has decreased in size and not tender anymore. 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 Her Tdap and flu vaccine are up-to-date. Her primary physician Dr.Slatosky who has been monitoring her hyperlipidemia for which she's currently on a statin.    Last year patient underwent an endometrial biopsy and sonohysterogram because of dysfunctional uterine bleeding and pathology report was benign and TSH and prolactin FSH were normal and patient reports now normal menstrual cycles. Patient has had history of bilateral salpingectomy as a result of ectopic pregnancies in the past.  Patient continues to smoke half a pack cigarette despite numerous occasions that she has been counseled and offered treatment. She is overdue for chest x-ray has been 10 years since her last one.   Past medical history,surgical history, family history and social history were all reviewed and documented in the EPIC chart.  Gynecologic History Patient's last menstrual period was 03/11/2015. Contraception: tubal ligation Last Pap: 2014. Results were: normal Last mammogram: 2015. Results were: Normal 3-D dimension mammogram  Obstetric History OB  History  Gravida Para Term Preterm AB SAB TAB Ectopic Multiple Living  3 0   3 1  2   0    # Outcome Date GA Lbr Len/2nd Weight Sex Delivery Anes PTL Lv  3 Ectopic           2 Ectopic           1 SAB                ROS: A ROS was performed and pertinent positives and negatives are included in the history.  GENERAL: No fevers or chills. HEENT: No change in vision, no earache, sore throat or sinus congestion. NECK: No pain or stiffness. CARDIOVASCULAR: No chest pain or pressure. No palpitations. PULMONARY: No shortness of breath, cough or wheeze. GASTROINTESTINAL: No abdominal pain, nausea, vomiting or diarrhea, melena or bright red blood per rectum. GENITOURINARY: No urinary frequency, urgency, hesitancy or dysuria. MUSCULOSKELETAL: No joint or muscle pain, no back pain, no recent trauma. DERMATOLOGIC: No rash, no itching, no lesions. ENDOCRINE: No polyuria, polydipsia, no heat or cold intolerance. No recent change in weight. HEMATOLOGICAL: No anemia or easy bruising or bleeding. NEUROLOGIC: No headache, seizures, numbness, tingling or weakness. PSYCHIATRIC: No depression, no loss of interest in normal activity or change in sleep pattern.     Exam: chaperone present  BP 130/84 mmHg  Ht 5' 2.25" (1.581 m)  Wt 155 lb (70.308 kg)  BMI 28.13 kg/m2  LMP 03/11/2015  Body mass index is 28.13 kg/(m^2).  General appearance : Well developed well nourished female. No acute distress HEENT: Eyes: no retinal hemorrhage or exudates,  Neck supple, trachea midline, no carotid bruits, no thyroidmegaly Lungs: Clear to auscultation, no rhonchi or wheezes, or rib retractions  Heart: Regular rate and rhythm, no murmurs or gallops Breast:Examined in sitting and supine position were symmetrical in appearance, small epidermal inclusion cyst left upper outer quadrant of left breast.,  no skin retraction, no nipple inversion, no nipple discharge, no skin discoloration, no axillary or supraclavicular  lymphadenopathy Abdomen: no palpable masses or tenderness, no rebound or guarding Extremities: no edema or skin discoloration or tenderness  Pelvic:  Bartholin, Urethra, Skene Glands: Within normal limits              Physical Exam  Genitourinary:                   Vagina: No gross lesions or discharge  Cervix: No gross lesions or discharge  Uterus  anteverted, normal size, shape and consistency, non-tender and mobile  Adnexa  Without masses or tenderness  Anus and perineum  normal   Rectovaginal  normal sphincter tone without palpated masses or tenderness             Hemoccult not indicated    Assessment/Plan:  44 y.o. female for annual exam patient with past history of vulvar and vaginal dysplasia has been several years since her last colposcopy for a thorough evaluation she will return back to the office in one month meanwhile she'll continue to apply Neosporin to the epidermal inclusion cyst which she was treated for what was thought to be a folliculitis by the nurse practitioner. I've asked her to apply the Neosporin to the inferior portion of the left labia majora for a suspected folliculitis near as well. We will take a look at this in more detail with colposcopy in the next few weeks. Her PCP has been doing her blood work. Pap smear with HPV screening done today. She will have a chest x-ray PA and lateral today because of her smoking history. She was was again counseled as to the detrimental effects of smoking. Patient scheduled for mammogram this week.   Terrance Mass MD, 8:49 AM 03/20/2015

## 2015-03-21 LAB — CYTOLOGY - PAP

## 2015-05-08 ENCOUNTER — Ambulatory Visit (INDEPENDENT_AMBULATORY_CARE_PROVIDER_SITE_OTHER): Payer: 59 | Admitting: Gynecology

## 2015-05-08 ENCOUNTER — Encounter: Payer: Self-pay | Admitting: Gynecology

## 2015-05-08 VITALS — BP 128/86

## 2015-05-08 DIAGNOSIS — N9089 Other specified noninflammatory disorders of vulva and perineum: Secondary | ICD-10-CM

## 2015-05-08 DIAGNOSIS — F419 Anxiety disorder, unspecified: Secondary | ICD-10-CM | POA: Insufficient documentation

## 2015-05-08 DIAGNOSIS — Z87412 Personal history of vulvar dysplasia: Secondary | ICD-10-CM | POA: Diagnosis not present

## 2015-05-08 DIAGNOSIS — Z87411 Personal history of vaginal dysplasia: Secondary | ICD-10-CM | POA: Diagnosis not present

## 2015-05-08 MED ORDER — ALPRAZOLAM 0.25 MG PO TABS: 0.2500 mg | ORAL_TABLET | ORAL | Status: DC | PRN

## 2015-05-08 NOTE — Progress Notes (Signed)
   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 The left inferior labia majora biopsy was done with a key punch instrument and silver nitrate was applied for hemostasis. The lesion at the perirectal region at the 4 and 12:00 position were excised with a scalpel and silver was used for hemostasis and 2% lidocaine was applied to the area afterwards for postoperative analgesia. The colposcopic evaluation of the vagina did not demonstrated lesions or her cervix.  Assessment/plan: Patient with past history of vulvar and vaginal dysplasia with yearly colposcopic evaluation demonstrating the above lesions above that were biopsy. Will await pathology report and manage according. Patient requesting refill of her Xanax 0.25 mg which she takes on a when necessary basis anxiety.

## 2015-05-13 ENCOUNTER — Encounter: Payer: Self-pay | Admitting: Gynecology

## 2015-05-13 ENCOUNTER — Ambulatory Visit (INDEPENDENT_AMBULATORY_CARE_PROVIDER_SITE_OTHER): Payer: 59 | Admitting: Gynecology

## 2015-05-13 VITALS — BP 136/88

## 2015-05-13 DIAGNOSIS — D071 Carcinoma in situ of vulva: Secondary | ICD-10-CM

## 2015-05-13 DIAGNOSIS — A630 Anogenital (venereal) warts: Secondary | ICD-10-CM | POA: Diagnosis not present

## 2015-05-13 NOTE — Progress Notes (Signed)
Patient is a 45 year old who was seen last month for her annual exam who has a past history of vaginal and vulvar dysplasia. As a result of this we are doing yearly colposcopic evaluation for close surveillance. She is here to discuss her pathology report. 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 Her Tdap and flu vaccine are up-to-date. Her primary physician Dr.Slatosky who has been monitoring her hyperlipidemia for which she's currently on a statin.    Last year patient underwent an endometrial biopsy and sonohysterogram because of dysfunctional uterine bleeding and pathology report was benign and TSH and prolactin FSH were normal and patient reports now normal menstrual cycles. Patient has had history of bilateral salpingectomy as a result of ectopic pregnancies in the past.  Patient continues to smoke half a pack cigarette despite numerous occasions that she has been counseled and offered treatment. She is overdue for chest x-ray has been 10 years since her last one.  On 05/08/2015 patient underwent a detail colposcopic evaluation with the following noted: Colposcopic evaluation today: Physical Exam  Genitourinary:     Patient underwent extensive colposcopic evaluation of the external genitalia, perineum perirectal region with areas identified above suspicious which were respectively biopsied after  the ears were cleansed with Betadine solution and 1% lidocaine was infiltrated. The left inferior labia majora biopsy was done with a key punch instrument and silver nitrate was applied for hemostasis. The lesion at the perirectal region at the 4 and 12:00 position were excised with a scalpel and silver was used for hemostasis and 2% lidocaine was applied to the area afterwards for postoperative analgesia. The colposcopic evaluation of the vagina did not demonstrated lesions or her cervix.            Pathology report: Diagnosis 1. 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 2. Perineum, biopsy, right perirectal, 8:00 o'clock - CONDYLOMA, SEE COMMENT. 3. 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 - SEE COMMENT. Microscopic Comment 1. -3. Dr. Gari Crown has reviewed the case. 2. There is small focus at one edge that is suspicious for high grade vulvar intraepithelial neoplasia (VIN-3) on initial levels, but this is not demonstrated on deeper levels. 3. There is a focus (initial levels) that is suspicious for superficial invasion, but this is not demonstrated on deeper levels. p16 reveals diffuse homogenous staining. ki-67 is increased in the basal to mid aspects. p53 is largely negative   Assessment/plan: We had a detailed discussion of the above-mentioned findings of VIN III (INFERIOR LEFT LABIA MAJORA AND PERIRECTAL REGION NEAR PERINEUM) she will probably need wide local excision of both of these areas along with CO2 laser of the area where the condyloma had been removed. I have placed a call to the GYN  oncologist Dr. Denman George to discuss findings with her to see if she is in agreement and perhaps if she would do the wide local excision especially the area and I'm concerned that near the rectal  mucosa as well. I will get back with the patient later this week after I speak with Dr. Denman George and coordinate management accordingly. Over 50% at time was spent counseling and coordinate and care for this patient with severe vulvar dysplasia.

## 2015-05-14 ENCOUNTER — Telehealth: Payer: Self-pay | Admitting: *Deleted

## 2015-05-14 NOTE — Telephone Encounter (Signed)
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

## 2015-05-14 NOTE — Telephone Encounter (Signed)
Appointment 05/21/15 @ 9:00am with Dr.Rossi, number given to [facility] [phone] pt aware of all this.

## 2015-05-20 ENCOUNTER — Encounter: Payer: Self-pay | Admitting: Gynecology

## 2015-05-21 ENCOUNTER — Encounter: Payer: Self-pay | Admitting: Gynecologic Oncology

## 2015-05-21 ENCOUNTER — Ambulatory Visit: Payer: 59 | Attending: Gynecologic Oncology | Admitting: Gynecologic Oncology

## 2015-05-21 VITALS — BP 129/82 | HR 67 | Temp 98.3°F | Resp 18 | Ht 62.0 in | Wt 161.8 lb

## 2015-05-21 DIAGNOSIS — F17210 Nicotine dependence, cigarettes, uncomplicated: Secondary | ICD-10-CM | POA: Diagnosis not present

## 2015-05-21 DIAGNOSIS — Z833 Family history of diabetes mellitus: Secondary | ICD-10-CM | POA: Insufficient documentation

## 2015-05-21 DIAGNOSIS — Z87898 Personal history of other specified conditions: Secondary | ICD-10-CM | POA: Insufficient documentation

## 2015-05-21 DIAGNOSIS — F419 Anxiety disorder, unspecified: Secondary | ICD-10-CM | POA: Insufficient documentation

## 2015-05-21 DIAGNOSIS — Z8249 Family history of ischemic heart disease and other diseases of the circulatory system: Secondary | ICD-10-CM | POA: Diagnosis not present

## 2015-05-21 DIAGNOSIS — N879 Dysplasia of cervix uteri, unspecified: Secondary | ICD-10-CM | POA: Diagnosis not present

## 2015-05-21 DIAGNOSIS — D071 Carcinoma in situ of vulva: Secondary | ICD-10-CM | POA: Insufficient documentation

## 2015-05-21 DIAGNOSIS — E7800 Pure hypercholesterolemia, unspecified: Secondary | ICD-10-CM | POA: Diagnosis not present

## 2015-05-21 DIAGNOSIS — Z9889 Other specified postprocedural states: Secondary | ICD-10-CM | POA: Diagnosis not present

## 2015-05-21 DIAGNOSIS — D013 Carcinoma in situ of anus and anal canal: Secondary | ICD-10-CM | POA: Insufficient documentation

## 2015-05-21 NOTE — Patient Instructions (Addendum)
Plan for a wide local excision and laser of the vulva at the [facility] on June 03, 2015.  You will receive a phone call from the surgery center prior to your surgery.

## 2015-05-21 NOTE — Progress Notes (Signed)
Consult Note: Gyn-Onc  Consult was requested by Dr. Toney Rakes for the evaluation of Pincus Large 45 y.o. female  CC:  Chief Complaint  Patient presents with  . VIN III    New Consultation    Assessment/Plan:  Ms. ROONEY HARRALSON  is a 45 y.o.  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  I am recommending wide local excision of the left lower labia majora and CO2 laser ablation of the anterior anal mucosa/anal verge.  I discussed anticipated healing and recovery of this procedure and postoperative instructions. I discussed that recurrence is highly probable given her history of remitting and relapsing dysplasia.  We discussed the role of tobacco use on development and recurrence of dysplasia.   HPI: Maureen Wall is a 45 year old woman who is seen in consultation at the request of Dr Toney Rakes for VIN III and AIN III.  The patient has a long history of recurrent dysplasia of the cervix, vagina and vulva. Her most recent procedure was in 2009. She is an active smoker (half pack per day).  She saw Dr Toney Rakes on 05/08/15 for routine evaluation and colposcopic evaluation of the vulva revealed acetowhite skin tag at the posterior left labia majora that was biopsied for VIN 3. A biopsy from the perianal perineum revealed VIN 3 /AIN 3.   Current Meds:  Outpatient Encounter Prescriptions as of 05/21/2015  Medication Sig  . ALPRAZolam (XANAX) 0.25 MG tablet Take 1 tablet (0.25 mg total) by mouth as needed.  . calcium carbonate (OS-CAL) 600 MG TABS Take 600 mg by mouth 2 (two) times daily with a meal.    . cholecalciferol (VITAMIN D) 1000 UNITS tablet Take 1,000 Units by mouth daily.  . simvastatin (ZOCOR) 40 MG tablet Take 40 mg by mouth at bedtime.    . [DISCONTINUED] cephALEXin (KEFLEX) 500 MG capsule Take 1 capsule (500 mg total) by mouth 3 (three) times daily. (Patient not taking: Reported on 05/08/2015)   No facility-administered encounter medications on file as of 05/21/2015.    Allergy:   Allergies  Allergen Reactions  . Morphine And Related   . Septra [Bactrim]     Social Hx:   Social History   Social History  . Marital Status: Married    Spouse Name: N/A  . Number of Children: N/A  . Years of Education: N/A   Occupational History  . Not on file.   Social History Main Topics  . Smoking status: Current Every Day Smoker -- 0.50 packs/day    Types: Cigarettes  . Smokeless tobacco: Never Used  . Alcohol Use: 0.0 oz/week    0 Standard drinks or equivalent per week     Comment: OCCASIONALLY  . Drug Use: No  . Sexual Activity: Yes    Birth Control/ Protection: None   Other Topics Concern  . Not on file   Social History Narrative    Past Surgical Hx:  Past Surgical History  Procedure Laterality Date  . Laser ablation of the cervix      AND  VAGINA  . Wide local excision      OF LEFT LABIA MINORA  . Salpingectomy      RIGHT  . Pelvic laparoscopy      LSO    Past Medical Hx:  Past Medical History  Diagnosis Date  . Cervical dysplasia   . Ectopic pregnancy     LEFT Salpingoophorectomy  . Ectopic pregnancy     LEFT  . Active smoker  1/2 PPD  . High cholesterol   . Anxiety   . Vaginal intraepithelial neoplasia III (VAIN III)     laeft labia majora/VIN III not VAIN III    Past Gynecological History:  Dysplasia - recurrent  Patient's last menstrual period was 04/28/2015.  Family Hx:  Family History  Problem Relation Age of Onset  . Diabetes Sister   . Diabetes Brother   . Hypertension Brother   . Diabetes Maternal Grandmother     Review of Systems:  Constitutional  Feels well,    ENT Normal appearing ears and nares bilaterally Skin/Breast  No rash, sores, jaundice, itching, dryness Cardiovascular  No chest pain, shortness of breath, or edema  Pulmonary  No cough or wheeze.  Gastro Intestinal  No nausea, vomitting, or diarrhoea. No bright red blood per rectum, no abdominal pain, change in bowel movement, or constipation.   Genito Urinary  No frequency, urgency, dysuria, no vulvar or anal pruritis Musculo Skeletal  No myalgia, arthralgia, joint swelling or pain  Neurologic  No weakness, numbness, change in gait,  Psychology  No depression, anxiety, insomnia.   Vitals:  Blood pressure 129/82, pulse 67, temperature 98.3 F (36.8 C), temperature source Oral, resp. rate 18, height 5\' 2"  (1.575 m), weight 161 lb 12.8 oz (73.392 kg), last menstrual period 04/28/2015, SpO2 100 %.  Physical Exam: WD in NAD Neck  Supple NROM, without any enlargements.  Lymph Node Survey No cervical supraclavicular or inguinal adenopathy Psychiatry  Alert and oriented to person, place, and time  Genito Urinary  Vulva/vagina: Normal external female genitalia. 4% acetic acid was applied to vulva and anus. Acetowhite changes noted at anterior anal verge anal mucosa. 2cm lesion. The left posterior labia majora lesion is erythematous and 62mm. Extremities  No bilateral cyanosis, clubbing or edema.   Donaciano Eva, MD  05/21/2015, 10:14 AM  CC: Dr Uvaldo Rising

## 2015-05-30 ENCOUNTER — Encounter (HOSPITAL_BASED_OUTPATIENT_CLINIC_OR_DEPARTMENT_OTHER): Payer: Self-pay | Admitting: *Deleted

## 2015-05-30 NOTE — Progress Notes (Signed)
NPO AFTER MN.  ARRIVE AT 0630.  NEEDS HG AND URINE PREG.

## 2015-06-05 ENCOUNTER — Ambulatory Visit (HOSPITAL_BASED_OUTPATIENT_CLINIC_OR_DEPARTMENT_OTHER)
Admission: RE | Admit: 2015-06-05 | Discharge: 2015-06-05 | Disposition: A | Discharge status: Home / Self Care | Payer: 59 | Source: Ambulatory Visit | Attending: Gynecologic Oncology | Admitting: Gynecologic Oncology

## 2015-06-05 ENCOUNTER — Encounter (HOSPITAL_BASED_OUTPATIENT_CLINIC_OR_DEPARTMENT_OTHER)
Admission: RE | Disposition: A | Discharge status: Home / Self Care | Payer: Self-pay | Source: Ambulatory Visit | Attending: Gynecologic Oncology

## 2015-06-05 ENCOUNTER — Ambulatory Visit (HOSPITAL_BASED_OUTPATIENT_CLINIC_OR_DEPARTMENT_OTHER): Payer: 59 | Admitting: Anesthesiology

## 2015-06-05 ENCOUNTER — Encounter (HOSPITAL_BASED_OUTPATIENT_CLINIC_OR_DEPARTMENT_OTHER): Payer: Self-pay | Admitting: Gynecologic Oncology

## 2015-06-05 DIAGNOSIS — N891 Moderate vaginal dysplasia: Secondary | ICD-10-CM | POA: Diagnosis present

## 2015-06-05 DIAGNOSIS — D013 Carcinoma in situ of anus and anal canal: Secondary | ICD-10-CM | POA: Diagnosis not present

## 2015-06-05 DIAGNOSIS — E7800 Pure hypercholesterolemia, unspecified: Secondary | ICD-10-CM | POA: Insufficient documentation

## 2015-06-05 DIAGNOSIS — D071 Carcinoma in situ of vulva: Secondary | ICD-10-CM | POA: Diagnosis present

## 2015-06-05 DIAGNOSIS — Z79899 Other long term (current) drug therapy: Secondary | ICD-10-CM | POA: Diagnosis not present

## 2015-06-05 DIAGNOSIS — F17210 Nicotine dependence, cigarettes, uncomplicated: Secondary | ICD-10-CM | POA: Insufficient documentation

## 2015-06-05 DIAGNOSIS — F419 Anxiety disorder, unspecified: Secondary | ICD-10-CM | POA: Diagnosis not present

## 2015-06-05 HISTORY — DX: Presence of spectacles and contact lenses: Z97.3

## 2015-06-05 HISTORY — DX: Personal history of cervical dysplasia: Z87.410

## 2015-06-05 HISTORY — PX: VULVECTOMY: SHX1086

## 2015-06-05 HISTORY — DX: Hyperlipidemia, unspecified: E78.5

## 2015-06-05 HISTORY — DX: Personal history of other complications of pregnancy, childbirth and the puerperium: Z87.59

## 2015-06-05 HISTORY — PX: CO2 LASER APPLICATION: SHX5778

## 2015-06-05 HISTORY — DX: Personal history of vulvar dysplasia: Z87.412

## 2015-06-05 HISTORY — DX: Carcinoma in situ of anus and anal canal: D01.3

## 2015-06-05 HISTORY — DX: Personal history of vaginal dysplasia: Z87.411

## 2015-06-05 LAB — POCT HEMOGLOBIN-HEMACUE: HEMOGLOBIN: 15.6 g/dL — AB (ref 12.0–15.0)

## 2015-06-05 LAB — POCT PREGNANCY, URINE: PREG TEST UR: NEGATIVE

## 2015-06-05 SURGERY — WIDE EXCISION VULVECTOMY
Anesthesia: General | Laterality: Left

## 2015-06-05 MED ORDER — DEXAMETHASONE SODIUM PHOSPHATE 10 MG/ML IJ SOLN
INTRAMUSCULAR | Status: AC
Start: 2015-06-05 — End: 2015-06-05
  Filled 2015-06-05: qty 1

## 2015-06-05 MED ORDER — LACTATED RINGERS IV SOLN
INTRAVENOUS | Status: DC
  Administered 2015-06-05: 07:00:00 via INTRAVENOUS
  Filled 2015-06-05: qty 1000

## 2015-06-05 MED ORDER — MIDAZOLAM HCL 5 MG/5ML IJ SOLN
INTRAMUSCULAR | Status: DC | PRN
  Administered 2015-06-05: 2 mg via INTRAVENOUS

## 2015-06-05 MED ORDER — KETOROLAC TROMETHAMINE 30 MG/ML IJ SOLN
INTRAMUSCULAR | Status: AC
  Filled 2015-06-05: qty 1

## 2015-06-05 MED ORDER — OXYCODONE-ACETAMINOPHEN 5-325 MG PO TABS: 1.0000 | ORAL_TABLET | ORAL | Status: DC | PRN

## 2015-06-05 MED ORDER — ONDANSETRON HCL 4 MG/2ML IJ SOLN
INTRAMUSCULAR | Status: DC | PRN
  Administered 2015-06-05: 4 mg via INTRAVENOUS

## 2015-06-05 MED ORDER — DOCUSATE SODIUM 100 MG PO CAPS: 100.0000 mg | ORAL_CAPSULE | Freq: Two times a day (BID) | ORAL | Status: DC

## 2015-06-05 MED ORDER — PROPOFOL 10 MG/ML IV BOLUS
INTRAVENOUS | Status: DC | PRN
  Administered 2015-06-05: 200 mg via INTRAVENOUS

## 2015-06-05 MED ORDER — DEXAMETHASONE SODIUM PHOSPHATE 4 MG/ML IJ SOLN
INTRAMUSCULAR | Status: DC | PRN
  Administered 2015-06-05: 10 mg via INTRAVENOUS

## 2015-06-05 MED ORDER — FENTANYL CITRATE (PF) 100 MCG/2ML IJ SOLN
25.0000 ug | INTRAMUSCULAR | Status: DC | PRN
  Filled 2015-06-05: qty 1

## 2015-06-05 MED ORDER — ACETIC ACID 5 % SOLN
Status: DC | PRN
  Administered 2015-06-05: 1 via TOPICAL

## 2015-06-05 MED ORDER — ONDANSETRON HCL 4 MG/2ML IJ SOLN
INTRAMUSCULAR | Status: AC
Start: 2015-06-05 — End: 2015-06-05
  Filled 2015-06-05: qty 2

## 2015-06-05 MED ORDER — FENTANYL CITRATE (PF) 100 MCG/2ML IJ SOLN
INTRAMUSCULAR | Status: DC | PRN
  Administered 2015-06-05 (×4): 25 ug via INTRAVENOUS

## 2015-06-05 MED ORDER — FENTANYL CITRATE (PF) 100 MCG/2ML IJ SOLN
INTRAMUSCULAR | Status: AC
  Filled 2015-06-05: qty 2

## 2015-06-05 MED ORDER — ONDANSETRON HCL 4 MG/2ML IJ SOLN
4.0000 mg | Freq: Four times a day (QID) | INTRAMUSCULAR | Status: DC | PRN
  Filled 2015-06-05: qty 2

## 2015-06-05 MED ORDER — MIDAZOLAM HCL 2 MG/2ML IJ SOLN
INTRAMUSCULAR | Status: AC
  Filled 2015-06-05: qty 2

## 2015-06-05 MED ORDER — OXYCODONE HCL 5 MG PO TABS
5.0000 mg | ORAL_TABLET | Freq: Once | ORAL | Status: DC | PRN
  Filled 2015-06-05: qty 1

## 2015-06-05 MED ORDER — LIDOCAINE HCL (CARDIAC) 20 MG/ML IV SOLN
INTRAVENOUS | Status: DC | PRN
  Administered 2015-06-05: 60 mg via INTRAVENOUS

## 2015-06-05 MED ORDER — OXYCODONE HCL 5 MG/5ML PO SOLN
5.0000 mg | Freq: Once | ORAL | Status: DC | PRN
  Filled 2015-06-05: qty 5

## 2015-06-05 MED ORDER — PROPOFOL 10 MG/ML IV BOLUS
INTRAVENOUS | Status: AC
  Filled 2015-06-05: qty 20

## 2015-06-05 MED ORDER — LIDOCAINE HCL (CARDIAC) 20 MG/ML IV SOLN
INTRAVENOUS | Status: AC
  Filled 2015-06-05: qty 5

## 2015-06-05 MED ORDER — LIDOCAINE HCL 1 % IJ SOLN
INTRAMUSCULAR | Status: DC | PRN
  Administered 2015-06-05: 10 mL

## 2015-06-05 MED ORDER — KETOROLAC TROMETHAMINE 30 MG/ML IJ SOLN
INTRAMUSCULAR | Status: DC | PRN
  Administered 2015-06-05: 30 mg via INTRAVENOUS

## 2015-06-05 MED ORDER — SILVER SULFADIAZINE 1 % EX CREA: 1.0000 "application " | TOPICAL_CREAM | CUTANEOUS | Status: DC | PRN

## 2015-06-05 SURGICAL SUPPLY — 64 items
APPLICATOR COTTON TIP 6IN STRL (MISCELLANEOUS) ×4 IMPLANT
BAG URINE DRAINAGE (UROLOGICAL SUPPLIES) IMPLANT
BAG URINE LEG 19OZ MD ST LTX (BAG) IMPLANT
BLADE CLIPPER SURG (BLADE) IMPLANT
BLADE SURG 15 STRL LF DISP TIS (BLADE) ×1 IMPLANT
BLADE SURG 15 STRL SS (BLADE) ×1
BNDG GAUZE ELAST 4 BULKY (GAUZE/BANDAGES/DRESSINGS) IMPLANT
BRIEF STRETCH FOR OB PAD LRG (UNDERPADS AND DIAPERS) ×2 IMPLANT
CANISTER SUCTION 1200CC (MISCELLANEOUS) IMPLANT
CANISTER SUCTION 2500CC (MISCELLANEOUS) ×2 IMPLANT
CATH FOLEY 2WAY SLVR  5CC 14FR (CATHETERS)
CATH FOLEY 2WAY SLVR  5CC 16FR (CATHETERS)
CATH FOLEY 2WAY SLVR 5CC 14FR (CATHETERS) IMPLANT
CATH FOLEY 2WAY SLVR 5CC 16FR (CATHETERS) IMPLANT
CATH ROBINSON RED A/P 14FR (CATHETERS) ×2 IMPLANT
CATH ROBINSON RED A/P 16FR (CATHETERS) IMPLANT
CLOTH BEACON ORANGE TIMEOUT ST (SAFETY) ×2 IMPLANT
COVER BACK TABLE 60X90IN (DRAPES) ×2 IMPLANT
DEPRESSOR TONGUE BLADE STERILE (MISCELLANEOUS) ×2 IMPLANT
DRAPE LG THREE QUARTER DISP (DRAPES) ×2 IMPLANT
DRAPE UNDERBUTTOCKS STRL (DRAPE) ×2 IMPLANT
DRSG TELFA 3X8 NADH (GAUZE/BANDAGES/DRESSINGS) IMPLANT
ELECT BALL LEEP 3MM BLK (ELECTRODE) IMPLANT
ELECT REM PT RETURN 9FT ADLT (ELECTROSURGICAL) ×2
ELECTRODE REM PT RTRN 9FT ADLT (ELECTROSURGICAL) ×1 IMPLANT
GAUZE SPONGE 4X4 12PLY STRL (GAUZE/BANDAGES/DRESSINGS) IMPLANT
GAUZE SPONGE 4X4 16PLY XRAY LF (GAUZE/BANDAGES/DRESSINGS) IMPLANT
GLOVE BIO SURGEON STRL SZ 6 (GLOVE) ×4 IMPLANT
GOWN STRL REUS W/ TWL LRG LVL3 (GOWN DISPOSABLE) ×2 IMPLANT
GOWN STRL REUS W/TWL LRG LVL3 (GOWN DISPOSABLE) ×2
KIT ROOM TURNOVER WOR (KITS) ×2 IMPLANT
LEGGING LITHOTOMY PAIR STRL (DRAPES) ×2 IMPLANT
MANIFOLD NEPTUNE II (INSTRUMENTS) IMPLANT
NEEDLE HYPO 22GX1.5 SAFETY (NEEDLE) IMPLANT
NEEDLE HYPO 25X1 1.5 SAFETY (NEEDLE) ×2 IMPLANT
NS IRRIG 500ML POUR BTL (IV SOLUTION) ×2 IMPLANT
PACK BASIN DAY SURGERY FS (CUSTOM PROCEDURE TRAY) ×2 IMPLANT
PAD OB MATERNITY 4.3X12.25 (PERSONAL CARE ITEMS) ×2 IMPLANT
PAD PREP 24X48 CUFFED NSTRL (MISCELLANEOUS) ×2 IMPLANT
PENCIL BUTTON HOLSTER BLD 10FT (ELECTRODE) ×2 IMPLANT
SCOPETTES 8  STERILE (MISCELLANEOUS) ×2
SCOPETTES 8 STERILE (MISCELLANEOUS) ×2 IMPLANT
SUT VIC AB 0 SH 27 (SUTURE) ×2 IMPLANT
SUT VIC AB 2-0 CT2 27 (SUTURE) ×4 IMPLANT
SUT VIC AB 2-0 SH 27 (SUTURE) ×1
SUT VIC AB 2-0 SH 27X BRD (SUTURE) ×1 IMPLANT
SUT VIC AB 2-0 SH 27XBRD (SUTURE) IMPLANT
SUT VIC AB 3-0 PS2 18 (SUTURE) ×1
SUT VIC AB 3-0 PS2 18XBRD (SUTURE) ×1 IMPLANT
SUT VIC AB 3-0 SH 27 (SUTURE) ×1
SUT VIC AB 3-0 SH 27X BRD (SUTURE) ×1 IMPLANT
SUT VICRYL 2 0 18  UND BR (SUTURE)
SUT VICRYL 2 0 18 UND BR (SUTURE) IMPLANT
SUT VICRYL 4-0 PS2 18IN ABS (SUTURE) ×6 IMPLANT
SYR BULB IRRIGATION 50ML (SYRINGE) ×2 IMPLANT
SYR CONTROL 10ML LL (SYRINGE) ×2 IMPLANT
TOWEL OR 17X24 6PK STRL BLUE (TOWEL DISPOSABLE) ×4 IMPLANT
TRAY DSU PREP LF (CUSTOM PROCEDURE TRAY) ×2 IMPLANT
TUBE CONNECTING 12X1/4 (SUCTIONS) ×2 IMPLANT
UNDERPAD 30X30 INCONTINENT (UNDERPADS AND DIAPERS) ×2 IMPLANT
VACUUM HOSE 7/8X10 W/ WAND (MISCELLANEOUS) IMPLANT
VACUUM HOSE/TUBING 7/8INX6FT (MISCELLANEOUS) ×2 IMPLANT
WATER STERILE IRR 500ML POUR (IV SOLUTION) ×2 IMPLANT
YANKAUER SUCT BULB TIP NO VENT (SUCTIONS) ×2 IMPLANT

## 2015-06-05 NOTE — Transfer of Care (Signed)
  Last Vitals:  Filed Vitals:   06/05/15 0647  BP: 125/82  Pulse: 72  Temp: 36.6 C  Resp: 16    Immediate Anesthesia Transfer of Care Note  Patient: Maureen Wall  Procedure(s) Performed: Procedure(s) (LRB): WIDE LOCAL EXCISION OF VULVA (Left) CO2 LASER ABLATION OF ANTERIOR ANAL MUCOSA/ANAL VERGE LEFT LOWER LABIA MAJORA (Left)  Patient Location: PACU  Anesthesia Type: General  Level of Consciousness: awake, alert  and oriented  Airway & Oxygen Therapy: Patient Spontanous Breathing and Patient connected to nasal cannula oxygen  Post-op Assessment: Report given to PACU RN and Post -op Vital signs reviewed and stable  Post vital signs: Reviewed and stable  Complications: No apparent anesthesia complications

## 2015-06-05 NOTE — Interval H&P Note (Signed)
History and Physical Interval Note:  06/05/2015 6:55 AM  Maureen Wall  has presented today for surgery, with the diagnosis of VULVAR INTRAEPITHELIAL, NEOPLASIA THREE  The various methods of treatment have been discussed with the patient and family. After consideration of risks, benefits and other options for treatment, the patient has consented to  Procedure(s): WIDE LOCAL EXCISION OF VULVA (Left) CO2 LASER ABLATION OF ANTERIOR ANAL MUCOSA/ANAL VERGE LEFT LOWER LABIA MAJORA (Left) as a surgical intervention .  The patient's history has been reviewed, patient examined, no change in status, stable for surgery.  I have reviewed the patient's chart and labs.  Questions were answered to the patient's satisfaction.     Donaciano Eva

## 2015-06-05 NOTE — H&P (View-Only) (Signed)
Consult Note: Gyn-Onc  Consult was requested by Dr. Toney Rakes for the evaluation of Pincus Large 45 y.o. female  CC:  Chief Complaint  Patient presents with  . VIN III    New Consultation    Assessment/Plan:  Ms. EILAH BRADLE  is a 45 y.o.  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  I am recommending wide local excision of the left lower labia majora and CO2 laser ablation of the anterior anal mucosa/anal verge.  I discussed anticipated healing and recovery of this procedure and postoperative instructions. I discussed that recurrence is highly probable given her history of remitting and relapsing dysplasia.  We discussed the role of tobacco use on development and recurrence of dysplasia.   HPI: Dane Gemelli is a 45 year old woman who is seen in consultation at the request of Dr Toney Rakes for VIN III and AIN III.  The patient has a long history of recurrent dysplasia of the cervix, vagina and vulva. Her most recent procedure was in 2009. She is an active smoker (half pack per day).  She saw Dr Toney Rakes on 05/08/15 for routine evaluation and colposcopic evaluation of the vulva revealed acetowhite skin tag at the posterior left labia majora that was biopsied for VIN 3. A biopsy from the perianal perineum revealed VIN 3 /AIN 3.   Current Meds:  Outpatient Encounter Prescriptions as of 05/21/2015  Medication Sig  . ALPRAZolam (XANAX) 0.25 MG tablet Take 1 tablet (0.25 mg total) by mouth as needed.  . calcium carbonate (OS-CAL) 600 MG TABS Take 600 mg by mouth 2 (two) times daily with a meal.    . cholecalciferol (VITAMIN D) 1000 UNITS tablet Take 1,000 Units by mouth daily.  . simvastatin (ZOCOR) 40 MG tablet Take 40 mg by mouth at bedtime.    . [DISCONTINUED] cephALEXin (KEFLEX) 500 MG capsule Take 1 capsule (500 mg total) by mouth 3 (three) times daily. (Patient not taking: Reported on 05/08/2015)   No facility-administered encounter medications on file as of 05/21/2015.    Allergy:   Allergies  Allergen Reactions  . Morphine And Related   . Septra [Bactrim]     Social Hx:   Social History   Social History  . Marital Status: Married    Spouse Name: N/A  . Number of Children: N/A  . Years of Education: N/A   Occupational History  . Not on file.   Social History Main Topics  . Smoking status: Current Every Day Smoker -- 0.50 packs/day    Types: Cigarettes  . Smokeless tobacco: Never Used  . Alcohol Use: 0.0 oz/week    0 Standard drinks or equivalent per week     Comment: OCCASIONALLY  . Drug Use: No  . Sexual Activity: Yes    Birth Control/ Protection: None   Other Topics Concern  . Not on file   Social History Narrative    Past Surgical Hx:  Past Surgical History  Procedure Laterality Date  . Laser ablation of the cervix      AND  VAGINA  . Wide local excision      OF LEFT LABIA MINORA  . Salpingectomy      RIGHT  . Pelvic laparoscopy      LSO    Past Medical Hx:  Past Medical History  Diagnosis Date  . Cervical dysplasia   . Ectopic pregnancy     LEFT Salpingoophorectomy  . Ectopic pregnancy     LEFT  . Active smoker  1/2 PPD  . High cholesterol   . Anxiety   . Vaginal intraepithelial neoplasia III (VAIN III)     laeft labia majora/VIN III not VAIN III    Past Gynecological History:  Dysplasia - recurrent  Patient's last menstrual period was 04/28/2015.  Family Hx:  Family History  Problem Relation Age of Onset  . Diabetes Sister   . Diabetes Brother   . Hypertension Brother   . Diabetes Maternal Grandmother     Review of Systems:  Constitutional  Feels well,    ENT Normal appearing ears and nares bilaterally Skin/Breast  No rash, sores, jaundice, itching, dryness Cardiovascular  No chest pain, shortness of breath, or edema  Pulmonary  No cough or wheeze.  Gastro Intestinal  No nausea, vomitting, or diarrhoea. No bright red blood per rectum, no abdominal pain, change in bowel movement, or constipation.   Genito Urinary  No frequency, urgency, dysuria, no vulvar or anal pruritis Musculo Skeletal  No myalgia, arthralgia, joint swelling or pain  Neurologic  No weakness, numbness, change in gait,  Psychology  No depression, anxiety, insomnia.   Vitals:  Blood pressure 129/82, pulse 67, temperature 98.3 F (36.8 C), temperature source Oral, resp. rate 18, height 5\' 2"  (1.575 m), weight 161 lb 12.8 oz (73.392 kg), last menstrual period 04/28/2015, SpO2 100 %.  Physical Exam: WD in NAD Neck  Supple NROM, without any enlargements.  Lymph Node Survey No cervical supraclavicular or inguinal adenopathy Psychiatry  Alert and oriented to person, place, and time  Genito Urinary  Vulva/vagina: Normal external female genitalia. 4% acetic acid was applied to vulva and anus. Acetowhite changes noted at anterior anal verge anal mucosa. 2cm lesion. The left posterior labia majora lesion is erythematous and 23mm. Extremities  No bilateral cyanosis, clubbing or edema.   Donaciano Eva, MD  05/21/2015, 10:14 AM  CC: Dr Uvaldo Rising

## 2015-06-05 NOTE — Anesthesia Postprocedure Evaluation (Signed)
Anesthesia Post Note  Patient: Maureen Wall  Procedure(s) Performed: Procedure(s) (LRB): WIDE LOCAL EXCISION OF VULVA (Left) CO2 LASER ABLATION OF ANTERIOR ANAL MUCOSA/ANAL VERGE LEFT LOWER LABIA MAJORA (Left)  Patient location during evaluation: PACU Anesthesia Type: General Level of consciousness: awake and alert and patient cooperative Pain management: pain level controlled Vital Signs Assessment: post-procedure vital signs reviewed and stable Respiratory status: spontaneous breathing and respiratory function stable Cardiovascular status: stable Anesthetic complications: no    Last Vitals:  Filed Vitals:   06/05/15 0900 06/05/15 0929  BP: 117/81 127/78  Pulse: 62 62  Temp:  36.9 C  Resp: 15 16    Last Pain: There were no vitals filed for this visit.               Loa

## 2015-06-05 NOTE — Anesthesia Procedure Notes (Signed)
Procedure Name: LMA Insertion Date/Time: 06/05/2015 7:40 AM Performed by: Mechele Claude Pre-anesthesia Checklist: Patient identified, Emergency Drugs available, Suction available and Patient being monitored Patient Re-evaluated:Patient Re-evaluated prior to inductionOxygen Delivery Method: Circle System Utilized Preoxygenation: Pre-oxygenation with 100% oxygen Intubation Type: IV induction Ventilation: Mask ventilation without difficulty LMA: LMA inserted LMA Size: 4.0 Number of attempts: 1 Airway Equipment and Method: bite block Placement Confirmation: positive ETCO2 Tube secured with: Tape Dental Injury: Teeth and Oropharynx as per pre-operative assessment

## 2015-06-05 NOTE — Op Note (Signed)
PATIENT: Maureen Wall DATE: 06/05/15   Preop Diagnosis: VIN3 and AIN3  Postoperative Diagnosis: same  Surgery: Partial simple partial simple left vulvectomy and CO2 laser of anal mucosa  Surgeons:  Donaciano Eva, MD Assistant: none  Anesthesia: General   Estimated blood loss: <50ml  IVF:  130ml   Urine output: 10 ml   Complications: None   Pathology: left posterior labia majora with marking stitch at 12 o'clock  Operative findings: erythematous 103mm lesion on posterior left labia majora, acetowhite changes to anterior anal mucosa to anal verge.  Procedure: The patient was identified in the preoperative holding area. Informed consent was signed on the chart. Patient was seen history was reviewed and exam was performed.   The patient was then taken to the operating room and placed in the supine position with SCD hose on. General anesthesia was then induced without difficulty. She was then placed in the dorsolithotomy position. The perineum was prepped with Betadine. The vagina was prepped with Betadine. The patient was then draped after the prep was dried. A Foley catheter was inserted into the bladder under sterile conditions.  Timeout was performed the patient, procedure, antibiotic, allergy, and length of procedure. 5% acetic acid solution was applied to the perineum. The vulvar tissues were inspected for areas of acetowhite changes or leukoplakia. The lesion was identified and the marking pen was used to circumscribe the area with appropriate surgical margins. The subcuticular tissues were infiltrated with 1% lidocaine. The 15 blade scalpel was used to make an incision through the skin circumferentially as marked. The skin elipse was grasped and was separated from the underlying deep dermal tissues with the bovie device. After the specimen had been completely resected, it was oriented and marked at 12 o'clock with a 0-vicryl suture. The bovie was used to obtain  hemostasis at the surgical bed. The subcutaneous tissues were irrigated and made hemostatic. The deep dermal layer was approximated with 3-0vicryl mattress sutures to bring the skin edges into approximation and off tension. The wound was closed following langher's lines. The cutaneous layer was closed with interrupted 4-0 vicryl stitches and mattress sutures to ensure a tension free and hemostatic closure.  The patient was draped with wet towels bordering the surgical fields. The patient and all OR staff were confirmed to have laser safe eyewear and masks applied. The CO2 laser was set to 12 watts continuous and the laser was tested for accuracy on a wet tongue depressor. CO2 ablation of the anterior anal mucosa and anal verge took place. Silvadine cream was applied to the anal CO2 burn.  All instrument, suture, laparotomy, Ray-Tec, and needle counts were correct x2. The patient tolerated the procedure well and was taken recovery room in stable condition. This is Everitt Amber dictating an operative note on [employer].  Donaciano Eva, MD

## 2015-06-05 NOTE — Anesthesia Preprocedure Evaluation (Signed)
Anesthesia Evaluation  Patient identified by MRN, date of birth, ID band Patient awake    Reviewed: Allergy & Precautions, NPO status , Patient's Chart, lab work & pertinent test results  Airway Mallampati: II   Neck ROM: full    Dental   Pulmonary Current Smoker,    breath sounds clear to auscultation       Cardiovascular negative cardio ROS   Rhythm:regular Rate:Normal     Neuro/Psych PSYCHIATRIC DISORDERS Anxiety    GI/Hepatic   Endo/Other    Renal/GU      Musculoskeletal   Abdominal   Peds  Hematology   Anesthesia Other Findings   Reproductive/Obstetrics                             Anesthesia Physical Anesthesia Plan  ASA: II  Anesthesia Plan: General   Post-op Pain Management:    Induction: Intravenous  Airway Management Planned: LMA  Additional Equipment:   Intra-op Plan:   Post-operative Plan:   Informed Consent: I have reviewed the patients History and Physical, chart, labs and discussed the procedure including the risks, benefits and alternatives for the proposed anesthesia with the patient or authorized representative who has indicated his/her understanding and acceptance.     Plan Discussed with: CRNA, Anesthesiologist and Surgeon  Anesthesia Plan Comments:         Anesthesia Quick Evaluation

## 2015-06-05 NOTE — Discharge Instructions (Signed)
Vulvectomy, Care After The vulva is the external female genitalia, outside and around the vagina and pubic bone. It consists of:  The skin on, and in front of, the pubic bone.  The clitoris.  The labia majora (large lips) on the outside of the vagina.  The labia minora (small lips) around the opening of the vagina.  The opening and the skin in and around the vagina. A vulvectomy is the removal of the tissue of the vulva, which sometimes includes removal of the lymph nodes and tissue in the groin areas. These discharge instructions provide you with general information on caring for yourself after you leave the hospital. It is also important that you know the warning signs of complications, so that you can seek treatment. Please read the instructions outlined below and refer to this sheet in the next few weeks. Your caregiver may also give you specific information and medicines. If you have any questions or complications after discharge, please call your caregiver. ACTIVITY  Rest as much as possible the first two weeks after discharge.  Arrange to have help from family or others with your daily activities when you go home.  Avoid heavy lifting (more than 5 pounds), pushing, or pulling.  If you feel tired, balance your activity with rest periods.  Follow your caregiver's instruction about climbing stairs and driving a car.  Increase activity gradually.  Do not exercise until you have permission from your caregiver. LEG AND FOOT CARE If your doctor has removed lymph nodes from your groin area, there may be an increase in swelling of your legs and feet. You can help prevent swelling by doing the following:  Elevate your legs while sitting or lying down.  If your caregiver has ordered special stockings, wear them according to instructions.  Avoid standing in one place for long periods of time.  Call the physical therapy department if you have any questions about swelling or treatment  for swelling.  Avoid salt in your diet. It can cause fluid retention and swelling.  Do not cross your legs, especially when sitting. NUTRITION  You may resume your normal diet.  Drink 6 to 8 glasses of fluids a day.  Eat a healthy, balanced diet including portions of food from the meat (protein), milk, fruit, vegetable, and bread groups.  Your caregiver may recommend you take a multivitamin with iron. ELIMINATION  You may notice that your stream of urine is at a different angle, and may tend to spray. Using a plastic funnel may help to decrease urine spray.  If constipation occurs, drink more liquids, and add more fruits, vegetables, and bran to your diet. You may take a mild laxative, such as Milk of Magnesia, Metamucil, or a stool softener such as Colace, with permission from your caregiver.  Apply silvadine cream to the anal tissue after toiletting each time. HYGIENE  You may shower and wash your hair.  Check with your caregiver about tub baths.  Do not add any bath oils or chemicals to your bath water, after you have permission to take baths.  While passing urine, pour water from a bottle or spray over your vulva to dilute the urine as it passes the incision (this will decrease burning and discomfort).  Clean yourself well after moving your bowels.  After urinating, do not wipe. Dap or pat dry with toilet paper or a dry cleath soft cloth.  A sitz bath will help keep your perineal area clean, reduce swelling, and provide comfort.  Avoid  wearing underpants for the first 2 weeks and wear loose skirts to allow circulation of air around the incision  You do not need to apply dressings, salves or lotions to the wound.  The stitches are self-dissolving and will absorb and disappear over a couple of months (it is normal to notice the knot from the stitches on toilet paper after voiding). HOME CARE INSTRUCTIONS   Apply a soft ice pack (or frozen bag of peas) to your perineum  (vulva) every hour in the first 48 hours after surgery. This will reduce swelling.  Avoid activities that involve a lot of friction between your legs.  Avoid wearing pants or underpants in the 1st 2 weeks (skirts are preferable).  Take your temperature twice a day and record it, especially if you feel feverish or have chills.  Follow your caregiver's instructions about medicines, activity, and follow-up appointments after surgery.  Do not drink alcohol while taking pain medicine.  Change your dressing as advised by your caregiver.  You may take over-the-counter medicine for pain, recommended by your caregiver.  If your pain is not relieved with medicine, call your caregiver.  Do not take aspirin because it can cause bleeding.  Do not douche or use tampons (use a nonperfumed sanitary pad).  Do not have sexual intercourse until your caregiver gives you permission (typically 6 weeks postoperatively). Hugging, kissing, and playful sexual activity is fine with your caregiver's permission.  Warm sitz baths, with your caregiver's permission, are helpful to control swelling and discomfort.  Take showers instead of baths, until your caregiver gives you permission to take baths.  You may take a mild medicine for constipation, recommended by your caregiver. Bran foods and drinking a lot of fluids will help with constipation.  Make sure your family understands everything about your operation and recovery. SEEK MEDICAL CARE IF:   You notice swelling and redness around the wound area.  You notice a foul smell coming from the wound or on the surgical dressing.  You notice the wound is separating.  You have painful or bloody urination.  You develop nausea and vomiting.  You develop diarrhea.  You develop a rash.  You have a reaction or allergy from the medicine.  You feel dizzy or light-headed.  You need stronger pain medicine. SEEK IMMEDIATE MEDICAL CARE IF:   You develop a  temperature of 102 F (38.9 C) or higher.  You pass out.  You develop leg or chest pain.  You develop abdominal pain.  You develop shortness of breath.  You develop bleeding from the wound area.  You see pus in the wound area. MAKE SURE YOU:   Understand these instructions.  Will watch your condition.  Will get help right away if you are not doing well or get worse. Document Released: 11/11/2003 Document Revised: 08/13/2013 Document Reviewed: 02/28/2009 [facility] Patient Information 2015 [place], [place]. This information is not intended to replace advice given to you by your health care provider. Make sure you discuss any questions you have with your health care provider.   Post Anesthesia Home Care Instructions  Activity: Get plenty of rest for the remainder of the day. A responsible adult should stay with you for 24 hours following the procedure.  For the next 24 hours, DO NOT: -Drive a car -Paediatric nurse -Drink alcoholic beverages -Take any medication unless instructed by your physician -Make any legal decisions or sign important papers.  Meals: Start with liquid foods such as gelatin or soup. Progress to regular  foods as tolerated. Avoid greasy, spicy, heavy foods. If nausea and/or vomiting occur, drink only clear liquids until the nausea and/or vomiting subsides. Call your physician if vomiting continues.  Special Instructions/Symptoms: Your throat may feel dry or sore from the anesthesia or the breathing tube placed in your throat during surgery. If this causes discomfort, gargle with warm salt water. The discomfort should disappear within 24 hours.  If you had a scopolamine patch placed behind your ear for the management of post- operative nausea and/or vomiting:  1. The medication in the patch is effective for 72 hours, after which it should be removed.  Wrap patch in a tissue and discard in the trash. Wash hands thoroughly with soap and water. 2. You may  remove the patch earlier than 72 hours if you experience unpleasant side effects which may include dry mouth, dizziness or visual disturbances. 3. Avoid touching the patch. Wash your hands with soap and water after contact with the patch.

## 2015-06-06 ENCOUNTER — Encounter (HOSPITAL_BASED_OUTPATIENT_CLINIC_OR_DEPARTMENT_OTHER): Payer: Self-pay | Admitting: Gynecologic Oncology

## 2015-06-10 ENCOUNTER — Telehealth: Payer: Self-pay | Admitting: Gynecologic Oncology

## 2015-06-10 NOTE — Telephone Encounter (Signed)
Patient doing well post-operatively.  Informed of final path results.  Follow up appt made.  Advised to call for any questions or concerns.

## 2015-06-27 ENCOUNTER — Encounter: Payer: Self-pay | Admitting: Gynecologic Oncology

## 2015-06-27 ENCOUNTER — Ambulatory Visit: Payer: 59 | Attending: Gynecologic Oncology | Admitting: Gynecologic Oncology

## 2015-06-27 VITALS — BP 121/76 | HR 78 | Temp 98.5°F | Resp 18 | Ht 62.0 in | Wt 171.2 lb

## 2015-06-27 DIAGNOSIS — D071 Carcinoma in situ of vulva: Secondary | ICD-10-CM | POA: Diagnosis present

## 2015-06-27 NOTE — Progress Notes (Signed)
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   S/p wide local exision of the left vulva and CO2 laser of perianal anterior tissues on 06/05/15.   Plan: 1) Pathology reports reviewed today 2) Treatment counseling - I discussed the high (1 in 4) risk for recurrence with VIN3 and recommend 6 monthly follow-up for 12 months. She was given the opportunity to ask questions, which were answered to her satisfaction, and she is agreement with the above mentioned plan of care.  3)  Return to clinic to see Dr Toney Rakes in 6 months for a vulvar inspection. She should return to see me on a prn basis.  HPI:  Maureen Wall is a 45 y.o. year old [id number] initially seen in consultation on 05/21/15 referred by Dr Toney Rakes for [facility] and [place].  She then underwent a wide local excision of the left posterior labia majora and CO2 laser ablation of the anterior perianal tissues on [id number] without complications.  Her postoperative course was uncomplicated.  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  She is seen today for a postoperative check and to discuss her pathology results and ongoing plan.  Since discharge from the hospital, she is feeling well with no complaints.  She has improving appetite, normal bowel and bladder function, and pain controlled with minimal PO medication. She has no other complaints today.  Review of systems: Constitutional:  She has no weight gain or weight loss. She has no fever or chills. Eyes: No blurred vision Ears, Nose, Mouth, Throat: No dizziness, headaches or changes in hearing. No mouth sores. Cardiovascular: No chest pain, palpitations or edema. Respiratory:  No shortness of breath, wheezing or cough Gastrointestinal: She has normal bowel movements without diarrhea or constipation. She denies any nausea or vomiting. She denies blood in her stool or heart burn. Genitourinary:  She denies pelvic pain, pelvic pressure or changes in her urinary  function. She has no hematuria, dysuria, or incontinence. She has no irregular vaginal bleeding or vaginal discharge Musculoskeletal: Denies muscle weakness or joint pains.  Skin:  She has no skin changes, rashes or itching Neurological:  Denies dizziness or headaches. No neuropathy, no numbness or tingling. Psychiatric:  She denies depression or anxiety. Hematologic/Lymphatic:   No easy bruising or bleeding   Physical Exam: Last menstrual period 05/24/2015. General: Well dressed, well nourished in no apparent distress.   HEENT:  Normocephalic and atraumatic, no lesions.  Extraocular muscles intact. Sclerae anicteric. Pupils equal, round, reactive. No mouth sores or ulcers. Thyroid is normal size, not nodular, midline. Genitourinary: very well healed left lower labial incision and perianal tissue. No erythema or discharge. Extremities: No cyanosis, clubbing or edema.  No calf tenderness or erythema. No palpable cords. Psychiatric: Mood and affect are appropriate. Neurological: Awake, alert and oriented x 3. Sensation is intact, no neuropathy.  Musculoskeletal: No pain, normal strength and range of motion.   Donaciano Eva, MD

## 2015-06-27 NOTE — Progress Notes (Signed)
Patient called after MD visit today asking if it is safe to exercise , Dr Everitt Amber updated . Orders received to instruct the patient that it is ok to exercise, patient updated and denies further questions at this time.

## 2015-06-27 NOTE — Patient Instructions (Signed)
Plan to follow up with Dr. Toney Rakes in Sept or sooner if issues or new symptoms arise.   Please call for any questions or concerns.

## 2015-11-28 ENCOUNTER — Ambulatory Visit (INDEPENDENT_AMBULATORY_CARE_PROVIDER_SITE_OTHER): Payer: 59 | Admitting: Gynecology

## 2015-11-28 ENCOUNTER — Encounter: Payer: Self-pay | Admitting: Gynecology

## 2015-11-28 VITALS — BP 124/84

## 2015-11-28 DIAGNOSIS — D071 Carcinoma in situ of vulva: Secondary | ICD-10-CM

## 2015-11-28 NOTE — Progress Notes (Signed)
Patient is a 45 year old who presented to the office today for 6 months follow-up  After the GYN oncologist Dr. Everitt Amber had done a partial simple left oophorectomy and CO2 laser of anal mucosa (history of VIN III and AINIII). She is doing well and is asymptomatic. I had referred her to her back in January as a result of the following history:  Patient is a 45 year old who was seen last month for her annual exam who has a past history of vaginal and vulvar dysplasia. As a result of this we are doing yearly colposcopic evaluation for close surveillance. She is here to discuss her pathology report. 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 2. Perineum, biopsy, right perirectal, 8:00 o'clock - CONDYLOMA, SEE COMMENT. 3. 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 - SEE COMMENT. Microscopic Comment 1. -3. Dr. Gari Crown has reviewed the case. 2. There is small focus at one edge that is suspicious for high grade vulvar intraepithelial neoplasia  (VIN-3) on initial levels, but this is not demonstrated on deeper levels. 3. There is a focus (initial levels) that is suspicious for superficial invasion, but this is not demonstrated on deeper levels. p16 reveals diffuse homogenous staining. ki-67 is increased in the basal to mid aspects. p53 is largely negative  The wire localization by Dr. Denman George pathology report demonstrated the following: Diagnosis Vulva, excision, left posterior labia majora - HIGH GRADE SQUAMOUS DYSPLASIA, VIN-III. - NO EVIDENCE OF INVASIVE CARCINOMA. - MARGINS NOT INVOLVED.   She is here for a six-month follow-up as per recommendation of the GYN oncologist.  Patient underwent a detail evaluation of the external genitalia, perineum, perirectal region with magnifying lens no lesions were seen even after application of acetic acid. The vagina was inspected as well and no abnormality was noted. Patient will return back to the office in 6 months for her annual exam and reassessment of the area as well. Previous wide local excision sac completely healed.   Time of consultation 50 minutes

## 2016-05-13 ENCOUNTER — Encounter: Payer: 59 | Admitting: Gynecology

## 2016-05-18 ENCOUNTER — Encounter: Payer: Self-pay | Admitting: Anesthesiology

## 2016-05-19 ENCOUNTER — Ambulatory Visit (INDEPENDENT_AMBULATORY_CARE_PROVIDER_SITE_OTHER): Payer: 59 | Admitting: Gynecology

## 2016-05-19 ENCOUNTER — Encounter: Payer: Self-pay | Admitting: Gynecology

## 2016-05-19 VITALS — BP 122/80 | Ht 62.0 in | Wt 176.0 lb

## 2016-05-19 DIAGNOSIS — Z01419 Encounter for gynecological examination (general) (routine) without abnormal findings: Secondary | ICD-10-CM | POA: Diagnosis not present

## 2016-05-19 DIAGNOSIS — K629 Disease of anus and rectum, unspecified: Secondary | ICD-10-CM | POA: Diagnosis not present

## 2016-05-19 DIAGNOSIS — F419 Anxiety disorder, unspecified: Secondary | ICD-10-CM

## 2016-05-19 DIAGNOSIS — D013 Carcinoma in situ of anus and anal canal: Secondary | ICD-10-CM | POA: Insufficient documentation

## 2016-05-19 MED ORDER — ALPRAZOLAM 0.25 MG PO TABS: 0.2500 mg | ORAL_TABLET | ORAL | 3 refills | Status: DC | PRN

## 2016-05-19 NOTE — Progress Notes (Signed)
Maureen Wall 06/09/1970 [id number]   History:    46 y.o.  for annual gyn exam with no complaints today. Patient was last seen the office on 11/28/2015 for 6 month follow-up colposcopic evaluation as a result of the following history:   After the GYN oncologist Dr. Everitt Amber had done a partial simple left oophorectomy and CO2 laser of anal mucosa (history of VIN III and AINIII). She is doing well and is asymptomatic. I had referred her to her back in January as a result of the following history:  past history of vaginal and vulvar dysplasia. As a result of this we are doing yearly colposcopic evaluation for close surveillance. She is here to discuss her pathology report. 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 2. Perineum, biopsy, right perirectal, 8:00 o'clock - CONDYLOMA, SEE COMMENT. 3. 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 - SEE COMMENT. Microscopic Comment 1. -3. Dr. Gari Crown has reviewed the case. 2. There is small focus  at one edge that is suspicious for high grade vulvar intraepithelial neoplasia (VIN-3) on initial levels, but this is not demonstrated on deeper levels. 3. There is a focus (initial levels) that is suspicious for superficial invasion, but this is not demonstrated on deeper levels. p16 reveals diffuse homogenous staining. ki-67 is increased in the basal to mid aspects. p53 is largely negative  The wire localization by Dr. Denman George pathology report demonstrated the following: Diagnosis Vulva, excision, left posterior labia majora - HIGH GRADE SQUAMOUS DYSPLASIA, VIN-III. - NO EVIDENCE OF INVASIVE CARCINOMA. - MARGINS NOT INVOLVED.  Patient reports normal menstrual cycle although the last one started a few days earlier than usual. She had a mammogram recently and has a call back do not have that report she is getting additional views. Her flu vaccines up-to-date and her PCP has been doing her blood work.  Past medical history,surgical history, family history and social history were all reviewed and documented in the EPIC chart.  Gynecologic History Patient's last menstrual period was 04/30/2016. Contraception: tubal ligation Last Pap: 2016. Results were: normal Last mammogram: See above. Results were: See above  Obstetric History OB History  Gravida Para Term Preterm AB Living  3 0     3 0  SAB TAB Ectopic Multiple Live Births  1   2        # Outcome Date GA Lbr Len/2nd Weight Sex Delivery Anes PTL Lv  3 Ectopic           2 Ectopic           1 SAB  ROS: A ROS was performed and pertinent positives and negatives are included in the history.  GENERAL: No fevers or chills. HEENT: No change in vision, no earache, sore throat or sinus congestion. NECK: No pain or stiffness. CARDIOVASCULAR: No chest pain or pressure. No palpitations. PULMONARY: No shortness of breath, cough or wheeze. GASTROINTESTINAL: No abdominal pain, nausea, vomiting or diarrhea, melena or bright red blood  per rectum. GENITOURINARY: No urinary frequency, urgency, hesitancy or dysuria. MUSCULOSKELETAL: No joint or muscle pain, no back pain, no recent trauma. DERMATOLOGIC: No rash, no itching, no lesions. ENDOCRINE: No polyuria, polydipsia, no heat or cold intolerance. No recent change in weight. HEMATOLOGICAL: No anemia or easy bruising or bleeding. NEUROLOGIC: No headache, seizures, numbness, tingling or weakness. PSYCHIATRIC: No depression, no loss of interest in normal activity or change in sleep pattern.     Exam: chaperone present  BP 122/80   Ht '5\' 2"'  (1.575 m)   Wt 176 lb (79.8 kg)   LMP 04/30/2016   BMI 32.19 kg/m   Body mass index is 32.19 kg/m.  General appearance : Well developed well nourished female. No acute distress HEENT: Eyes: no retinal hemorrhage or exudates,  Neck supple, trachea midline, no carotid bruits, no thyroidmegaly Lungs: Clear to auscultation, no rhonchi or wheezes, or rib retractions  Heart: Regular rate and rhythm, no murmurs or gallops Breast:Examined in sitting and supine position were symmetrical in appearance, no palpable masses or tenderness,  no skin retraction, no nipple inversion, no nipple discharge, no skin discoloration, no axillary or supraclavicular lymphadenopathy Abdomen: no palpable masses or tenderness, no rebound or guarding Extremities: no edema or skin discoloration or tenderness  Pelvic:  Bartholin, Urethra, Skene Glands: Within normal limits             Vagina: No gross lesions or discharge  Cervix: No gross lesions or discharge  Uterus  anteverted, normal size, shape and consistency, non-tender and mobile  Adnexa  Without masses or tenderness  Anus and perineum  normal   Rectovaginal Physical Exam  Genitourinary:                   Hemoccult not indicated     Assessment/Plan:  46 y.o. female for annual exam with past history of following dysplasia past described above. Incidental finding today of the hypopigmented area near  the perianal region there was cleansed with Betadine solution a key punch biopsy was obtained submitted for histological evaluation and silver night was used for hemostasis. A Pap smear was also done today. Patient should return back in 6 months for colposcopic evaluation as recommended by the GYN oncologist. Her PCP has been doing her blood work. Additional views from her mammogram pending.   Terrance Mass MD, 8:46 AM 05/19/2016

## 2016-05-19 NOTE — Addendum Note (Signed)
Addended by: Burnett Kanaris on: 05/19/2016 09:10 AM   Modules accepted: Orders

## 2016-05-20 LAB — PAP IG W/ RFLX HPV ASCU

## 2016-05-21 ENCOUNTER — Encounter: Payer: Self-pay | Admitting: Gynecology

## 2016-08-25 ENCOUNTER — Encounter: Payer: Self-pay | Admitting: Gynecology

## 2016-10-22 ENCOUNTER — Ambulatory Visit: Payer: 59 | Admitting: Gynecology

## 2016-10-29 ENCOUNTER — Encounter: Payer: Self-pay | Admitting: Gynecology

## 2016-10-29 ENCOUNTER — Ambulatory Visit (INDEPENDENT_AMBULATORY_CARE_PROVIDER_SITE_OTHER): Payer: 59 | Admitting: Gynecology

## 2016-10-29 VITALS — BP 124/80

## 2016-10-29 DIAGNOSIS — Z8719 Personal history of other diseases of the digestive system: Secondary | ICD-10-CM

## 2016-10-29 DIAGNOSIS — Z87411 Personal history of vaginal dysplasia: Secondary | ICD-10-CM | POA: Diagnosis not present

## 2016-10-29 DIAGNOSIS — Z87412 Personal history of vulvar dysplasia: Secondary | ICD-10-CM

## 2016-10-29 NOTE — Patient Instructions (Signed)
Perimenopause Perimenopause is the time when your body begins to move into the menopause (no menstrual period for 12 straight months). It is a natural process. Perimenopause can begin 2-8 years before the menopause and usually lasts for 1 year after the menopause. During this time, your ovaries may or may not produce an egg. The ovaries vary in their production of estrogen and progesterone hormones each month. This can cause irregular menstrual periods, difficulty getting pregnant, vaginal bleeding between periods, and uncomfortable symptoms. What are the causes?  Irregular production of the ovarian hormones, estrogen and progesterone, and not ovulating every month. Other causes include:  Tumor of the pituitary gland in the brain.  Medical disease that affects the ovaries.  Radiation treatment.  Chemotherapy.  Unknown causes.  Heavy smoking and excessive alcohol intake can bring on perimenopause sooner. What are the signs or symptoms?  Hot flashes.  Night sweats.  Irregular menstrual periods.  Decreased sex drive.  Vaginal dryness.  Headaches.  Mood swings.  Depression.  Memory problems.  Irritability.  Tiredness.  Weight gain.  Trouble getting pregnant.  The beginning of losing bone cells (osteoporosis).  The beginning of hardening of the arteries (atherosclerosis). How is this diagnosed? Your health care provider will make a diagnosis by analyzing your age, menstrual history, and symptoms. He or she will do a physical exam and note any changes in your body, especially your female organs. Female hormone tests may or may not be helpful depending on the amount of female hormones you produce and when you produce them. However, other hormone tests may be helpful to rule out other problems. How is this treated? In some cases, no treatment is needed. The decision on whether treatment is necessary during the perimenopause should be made by you and your health care  provider based on how the symptoms are affecting you and your lifestyle. Various treatments are available, such as:  Treating individual symptoms with a specific medicine for that symptom.  Herbal medicines that can help specific symptoms.  Counseling.  Group therapy. Follow these instructions at home:  Keep track of your menstrual periods (when they occur, how heavy they are, how long between periods, and how long they last) as well as your symptoms and when they started.  Only take over-the-counter or prescription medicines as directed by your health care provider.  Sleep and rest.  Exercise.  Eat a diet that contains calcium (good for your bones) and soy (acts like the estrogen hormone).  Do not smoke.  Avoid alcoholic beverages.  Take vitamin supplements as recommended by your health care provider. Taking vitamin E may help in certain cases.  Take calcium and vitamin D supplements to help prevent bone loss.  Group therapy is sometimes helpful.  Acupuncture may help in some cases. Contact a health care provider if:  You have questions about any symptoms you are having.  You need a referral to a specialist (gynecologist, psychiatrist, or psychologist). Get help right away if:  You have vaginal bleeding.  Your period lasts longer than 8 days.  Your periods are recurring sooner than 21 days.  You have bleeding after intercourse.  You have severe depression.  You have pain when you urinate.  You have severe headaches.  You have vision problems. This information is not intended to replace advice given to you by your health care provider. Make sure you discuss any questions you have with your health care provider. Document Released: 05/06/2004 Document Revised: 09/04/2015 Document Reviewed: 10/26/2012 Elsevier Interactive   Patient Education  2017 [employer]  

## 2016-10-29 NOTE — Progress Notes (Signed)
   Patient is a 46 year old that presented to the office today for her 1 year follow-up with colposcopy as a result of her past history:  Patient with history of VIN3 and AIN3. She had been referred to Dr. Everitt Amber who had performed a wide local excision of the left posterior labia majora and CO2 laser ablation of the anterior perianal tissues on 4/56/25 without complications.  Her postoperative course was uncomplicated.  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  Patient underwent a detail colposcopic evaluation external genitalia, perineum and perirectal region no lesions were noted even after applying acetic acid and using the blue light filter light. Speculum exam demonstrated normal vagina normal fornix normal ectocervix T-zone visualized. Acetic acid was applied. Also like filter utilized no abnormality was noted.  Patient with past history of VIN3 and AIN3 status post wide local excision and CO2 laser ablation by GYN oncologist doing well no recurrent lesions seen. Patient to return back to the office next year for annual exam and recommend continued colposcopic evaluation once a year.

## 2016-11-29 ENCOUNTER — Encounter: Payer: Self-pay | Admitting: Women's Health

## 2017-06-29 ENCOUNTER — Other Ambulatory Visit: Payer: Self-pay

## 2017-06-29 NOTE — Telephone Encounter (Signed)
Former patient of Dr. Moshe Salisbury. Last annual exam 05/19/16. Is scheduled to see you for annual exam on 07/19/17.  At 04/28/2015 visit Dr. Moshe Salisbury wrote "Patient requesting refill of her Xanax 0.25 mg which she takes on a when necessary basis anxiety."

## 2017-07-01 MED ORDER — ALPRAZOLAM 0.25 MG PO TABS: 0.2500 mg | ORAL_TABLET | ORAL | 1 refills | Status: DC | PRN

## 2017-07-01 NOTE — Telephone Encounter (Signed)
Called into pharmacy

## 2017-07-19 ENCOUNTER — Ambulatory Visit (INDEPENDENT_AMBULATORY_CARE_PROVIDER_SITE_OTHER): Payer: 59 | Admitting: Obstetrics & Gynecology

## 2017-07-19 ENCOUNTER — Encounter: Payer: Self-pay | Admitting: Obstetrics & Gynecology

## 2017-07-19 VITALS — BP 134/90 | Ht 62.5 in | Wt 206.0 lb

## 2017-07-19 DIAGNOSIS — Z1151 Encounter for screening for human papillomavirus (HPV): Secondary | ICD-10-CM | POA: Diagnosis not present

## 2017-07-19 DIAGNOSIS — Z01419 Encounter for gynecological examination (general) (routine) without abnormal findings: Secondary | ICD-10-CM

## 2017-07-19 DIAGNOSIS — F17210 Nicotine dependence, cigarettes, uncomplicated: Secondary | ICD-10-CM | POA: Diagnosis not present

## 2017-07-19 DIAGNOSIS — Z6837 Body mass index (BMI) 37.0-37.9, adult: Secondary | ICD-10-CM

## 2017-07-19 DIAGNOSIS — E6609 Other obesity due to excess calories: Secondary | ICD-10-CM | POA: Diagnosis not present

## 2017-07-19 DIAGNOSIS — Z87411 Personal history of vaginal dysplasia: Secondary | ICD-10-CM

## 2017-07-19 DIAGNOSIS — Z8719 Personal history of other diseases of the digestive system: Secondary | ICD-10-CM

## 2017-07-19 DIAGNOSIS — Z9079 Acquired absence of other genital organ(s): Secondary | ICD-10-CM

## 2017-07-19 DIAGNOSIS — Z87412 Personal history of vulvar dysplasia: Secondary | ICD-10-CM

## 2017-07-19 NOTE — Progress Notes (Signed)
Maureen Wall April 28, 1970 [id number]   History:    47 y.o. G3P0A3 Married.  Ectopics.  Status post bilateral salpingectomies.  1 adopted son 79 yo.    RP:  Established patient presenting for annual gyn exam   HPI: Patient with history of VIN3 and AIN3. She had been referred to Dr. Everitt Amber who had performed a wide local excision of the left posterior labia majoraand CO2 laser ablation of the anterior perianal tissues on 0/08/67YPPJKDT complications. Her postoperative course was uncomplicated. Her final pathology revealedVIN3 of the left labia majora.  Dr Toney Rakes did a Vulvar and Perianal Colposcopy which were negative in 10/2016.  Menstrual periods regular normal every months.  History of ectopic pregnancies and infertility.  No pelvic pain.  No pain with intercourse.  Status post bilateral salpingectomies.  Normal vaginal secretions.  Urine and bowel movements normal.  Breasts normal.  Body mass index 37.08.  Not regularly physically active.  Cigarette smoking every day.  Health labs with family physician.   Past medical history,surgical history, family history and social history were all reviewed and documented in the EPIC chart.  Gynecologic History Patient's last menstrual period was 07/03/2017. Contraception: S/P Bilateral Salpingectomy/Unilateral Oophorectomy Last Pap: 05/2016. Results were: Negative Last mammogram: 05/2017. Results were: Negative Bone Density: Never Colonoscopy: Never  Obstetric History OB History  Gravida Para Term Preterm AB Living  3 0     3 0  SAB TAB Ectopic Multiple Live Births  1   2        # Outcome Date GA Lbr Len/2nd Weight Sex Delivery Anes PTL Lv  3 Ectopic           2 Ectopic           1 SAB              ROS: A ROS was performed and pertinent positives and negatives are included in the history.  GENERAL: No fevers or chills. HEENT: No change in vision, no earache, sore throat or sinus congestion. NECK: No pain or stiffness.  CARDIOVASCULAR: No chest pain or pressure. No palpitations. PULMONARY: No shortness of breath, cough or wheeze. GASTROINTESTINAL: No abdominal pain, nausea, vomiting or diarrhea, melena or bright red blood per rectum. GENITOURINARY: No urinary frequency, urgency, hesitancy or dysuria. MUSCULOSKELETAL: No joint or muscle pain, no back pain, no recent trauma. DERMATOLOGIC: No rash, no itching, no lesions. ENDOCRINE: No polyuria, polydipsia, no heat or cold intolerance. No recent change in weight. HEMATOLOGICAL: No anemia or easy bruising or bleeding. NEUROLOGIC: No headache, seizures, numbness, tingling or weakness. PSYCHIATRIC: No depression, no loss of interest in normal activity or change in sleep pattern.     Exam:   BP 134/90   Ht 5' 2.5" (1.588 m)   Wt 206 lb (93.4 kg)   LMP 07/03/2017   BMI 37.08 kg/m   Body mass index is 37.08 kg/m.  General appearance : Well developed well nourished female. No acute distress HEENT: Eyes: no retinal hemorrhage or exudates,  Neck supple, trachea midline, no carotid bruits, no thyroidmegaly Lungs: Clear to auscultation, no rhonchi or wheezes, or rib retractions  Heart: Regular rate and rhythm, no murmurs or gallops Breast:Examined in sitting and supine position were symmetrical in appearance, no palpable masses or tenderness,  no skin retraction, no nipple inversion, no nipple discharge, no skin discoloration, no axillary or supraclavicular lymphadenopathy Abdomen: no palpable masses or tenderness, no rebound or guarding Extremities: no edema or skin discoloration or tenderness  Pelvic: Vulva: Normal             Vagina: No gross lesions or discharge  Cervix: No gross lesions or discharge.  Pap/HR HPV done.  Uterus  AV, normal size, shape and consistency, non-tender and mobile  Adnexa  Without masses or tenderness  Anus: Normal   Assessment/Plan:  47 y.o. female for annual exam   1. Encounter for routine gynecological examination with  Papanicolaou smear of cervix Normal gynecologic exam.  Pap with high-risk HPV done.  Breast exam normal.  Recent screening mammogram negative in February 2019.  Health labs with family physician.  2. Status post bilateral salpingectomy  3. Vulvar intraepithelial neoplasia (VIN) grade 3 Last colposcopy in July 2018.  Will repeat vulvar and peri-anal colposcopies every year.  4. AIN III (anal intraepithelial neoplasia III) As above.  5. Class 2 obesity due to excess calories without serious comorbidity with body mass index (BMI) of 37.0 to 37.9 in adult Recommend low calorie/low carb diet such as [employer].  Regular physical activity with aerobic activities 5 times a week and weightlifting every 2 days recommended.  6. Cigarette smoker Recommend to continue weaning with the goal of complete cessation.  Counseling on above issues and coordination of care more than 50% for 10 minutes.  [name] MD, 9:17 AM 07/19/2017

## 2017-07-21 LAB — PAP, TP IMAGING W/ HPV RNA, RFLX HPV TYPE 16,18/45: HPV DNA HIGH RISK: NOT DETECTED

## 2017-07-22 ENCOUNTER — Other Ambulatory Visit: Payer: Self-pay | Admitting: Obstetrics & Gynecology

## 2017-07-22 MED ORDER — METRONIDAZOLE 500 MG PO TABS: 500.0000 mg | ORAL_TABLET | Freq: Two times a day (BID) | ORAL | 0 refills | Status: DC

## 2017-07-24 ENCOUNTER — Encounter: Payer: Self-pay | Admitting: Obstetrics & Gynecology

## 2017-07-24 NOTE — Patient Instructions (Signed)
1. Encounter for routine gynecological examination with Papanicolaou smear of cervix Normal gynecologic exam.  Pap with high-risk HPV done.  Breast exam normal.  Recent screening mammogram negative in February 2019.  Health labs with family physician.  2. Status post bilateral salpingectomy  3. Vulvar intraepithelial neoplasia (VIN) grade 3 Last colposcopy in July 2018.  Will repeat vulvar and peri-anal colposcopies every year.  4. AIN III (anal intraepithelial neoplasia III) As above.  5. Class 2 obesity due to excess calories without serious comorbidity with body mass index (BMI) of 37.0 to 37.9 in adult Recommend low calorie/low carb diet such as [employer].  Regular physical activity with aerobic activities 5 times a week and weightlifting every 2 days recommended.  6. Cigarette smoker Recommend to continue weaning with the goal of complete cessation.  Maureen Wall, it was a pleasure meeting you today!  I will inform you of your results as soon as they are available.  Health Maintenance, Female Adopting a healthy lifestyle and getting preventive care can go a long way to promote health and wellness. Talk with your health care provider about what schedule of regular examinations is right for you. This is a good chance for you to check in with your provider about disease prevention and staying healthy. In between checkups, there are plenty of things you can do on your own. Experts have done a lot of research about which lifestyle changes and preventive measures are most likely to keep you healthy. Ask your health care provider for more information. Weight and diet Eat a healthy diet  Be sure to include plenty of vegetables, fruits, low-fat dairy products, and lean protein.  Do not eat a lot of foods high in solid fats, added sugars, or salt.  Get regular exercise. This is one of the most important things you can do for your health. ? Most adults should exercise for at least 150  minutes each week. The exercise should increase your heart rate and make you sweat (moderate-intensity exercise). ? Most adults should also do strengthening exercises at least twice a week. This is in addition to the moderate-intensity exercise.  Maintain a healthy weight  Body mass index (BMI) is a measurement that can be used to identify possible weight problems. It estimates body fat based on height and weight. Your health care provider can help determine your BMI and help you achieve or maintain a healthy weight.  For females 77 years of age and older: ? A BMI below 18.5 is considered underweight. ? A BMI of 18.5 to 24.9 is normal. ? A BMI of 25 to 29.9 is considered overweight. ? A BMI of 30 and above is considered obese.  Watch levels of cholesterol and blood lipids  You should start having your blood tested for lipids and cholesterol at 48 years of age, then have this test every 5 years.  You may need to have your cholesterol levels checked more often if: ? Your lipid or cholesterol levels are high. ? You are older than 48 years of age. ? You are at high risk for heart disease.  Cancer screening Lung Cancer  Lung cancer screening is recommended for adults 8-77 years old who are at high risk for lung cancer because of a history of smoking.  A yearly low-dose CT scan of the lungs is recommended for people who: ? Currently smoke. ? Have quit within the past 15 years. ? Have at least a 30-pack-year history of smoking. A pack year is  smoking an average of one pack of cigarettes a day for 1 year.  Yearly screening should continue until it has been 15 years since you quit.  Yearly screening should stop if you develop a health problem that would prevent you from having lung cancer treatment.  Breast Cancer  Practice breast self-awareness. This means understanding how your breasts normally appear and feel.  It also means doing regular breast self-exams. Let your health care  provider know about any changes, no matter how small.  If you are in your 20s or 30s, you should have a clinical breast exam (CBE) by a health care provider every 1-3 years as part of a regular health exam.  If you are 18 or older, have a CBE every year. Also consider having a breast X-ray (mammogram) every year.  If you have a family history of breast cancer, talk to your health care provider about genetic screening.  If you are at high risk for breast cancer, talk to your health care provider about having an MRI and a mammogram every year.  Breast cancer gene (BRCA) assessment is recommended for women who have family members with BRCA-related cancers. BRCA-related cancers include: ? Breast. ? Ovarian. ? Tubal. ? Peritoneal cancers.  Results of the assessment will determine the need for genetic counseling and BRCA1 and BRCA2 testing.  Cervical Cancer Your health care provider may recommend that you be screened regularly for cancer of the pelvic organs (ovaries, uterus, and vagina). This screening involves a pelvic examination, including checking for microscopic changes to the surface of your cervix (Pap test). You may be encouraged to have this screening done every 3 years, beginning at age 38.  For women ages 35-65, health care providers may recommend pelvic exams and Pap testing every 3 years, or they may recommend the Pap and pelvic exam, combined with testing for human papilloma virus (HPV), every 5 years. Some types of HPV increase your risk of cervical cancer. Testing for HPV may also be done on women of any age with unclear Pap test results.  Other health care providers may not recommend any screening for nonpregnant women who are considered low risk for pelvic cancer and who do not have symptoms. Ask your health care provider if a screening pelvic exam is right for you.  If you have had past treatment for cervical cancer or a condition that could lead to cancer, you need Pap tests  and screening for cancer for at least 20 years after your treatment. If Pap tests have been discontinued, your risk factors (such as having a new sexual partner) need to be reassessed to determine if screening should resume. Some women have medical problems that increase the chance of getting cervical cancer. In these cases, your health care provider may recommend more frequent screening and Pap tests.  Colorectal Cancer  This type of cancer can be detected and often prevented.  Routine colorectal cancer screening usually begins at 48 years of age and continues through 48 years of age.  Your health care provider may recommend screening at an earlier age if you have risk factors for colon cancer.  Your health care provider may also recommend using home test kits to check for hidden blood in the stool.  A small camera at the end of a tube can be used to examine your colon directly (sigmoidoscopy or colonoscopy). This is done to check for the earliest forms of colorectal cancer.  Routine screening usually begins at age 90.  Direct examination  of the colon should be repeated every 5-10 years through 48 years of age. However, you may need to be screened more often if early forms of precancerous polyps or small growths are found.  Skin Cancer  Check your skin from head to toe regularly.  Tell your health care provider about any new moles or changes in moles, especially if there is a change in a mole's shape or color.  Also tell your health care provider if you have a mole that is larger than the size of a pencil eraser.  Always use sunscreen. Apply sunscreen liberally and repeatedly throughout the day.  Protect yourself by wearing long sleeves, pants, a wide-brimmed hat, and sunglasses whenever you are outside.  Heart disease, diabetes, and high blood pressure  High blood pressure causes heart disease and increases the risk of stroke. High blood pressure is more likely to develop  in: ? People who have blood pressure in the high end of the normal range (130-139/85-89 mm Hg). ? People who are overweight or obese. ? People who are African American.  If you are 91-45 years of age, have your blood pressure checked every 3-5 years. If you are 24 years of age or older, have your blood pressure checked every year. You should have your blood pressure measured twice-once when you are at a hospital or clinic, and once when you are not at a hospital or clinic. Record the average of the two measurements. To check your blood pressure when you are not at a hospital or clinic, you can use: ? An automated blood pressure machine at a pharmacy. ? A home blood pressure monitor.  If you are between 30 years and 14 years old, ask your health care provider if you should take aspirin to prevent strokes.  Have regular diabetes screenings. This involves taking a blood sample to check your fasting blood sugar level. ? If you are at a normal weight and have a low risk for diabetes, have this test once every three years after 48 years of age. ? If you are overweight and have a high risk for diabetes, consider being tested at a younger age or more often. Preventing infection Hepatitis B  If you have a higher risk for hepatitis B, you should be screened for this virus. You are considered at high risk for hepatitis B if: ? You were born in a country where hepatitis B is common. Ask your health care provider which countries are considered high risk. ? Your parents were born in a high-risk country, and you have not been immunized against hepatitis B (hepatitis B vaccine). ? You have HIV or AIDS. ? You use needles to inject street drugs. ? You live with someone who has hepatitis B. ? You have had sex with someone who has hepatitis B. ? You get hemodialysis treatment. ? You take certain medicines for conditions, including cancer, organ transplantation, and autoimmune conditions.  Hepatitis C  Blood  testing is recommended for: ? Everyone born from [birth year] through 1966. ? Anyone with known risk factors for hepatitis C.  Sexually transmitted infections (STIs)  You should be screened for sexually transmitted infections (STIs) including gonorrhea and chlamydia if: ? You are sexually active and are younger than 48 years of age. ? You are older than 48 years of age and your health care provider tells you that you are at risk for this type of infection. ? Your sexual activity has changed since you were last screened and you are  at an increased risk for chlamydia or gonorrhea. Ask your health care provider if you are at risk.  If you do not have HIV, but are at risk, it may be recommended that you take a prescription medicine daily to prevent HIV infection. This is called pre-exposure prophylaxis (PrEP). You are considered at risk if: ? You are sexually active and do not regularly use condoms or know the HIV status of your partner(s). ? You take drugs by injection. ? You are sexually active with a partner who has HIV.  Talk with your health care provider about whether you are at high risk of being infected with HIV. If you choose to begin PrEP, you should first be tested for HIV. You should then be tested every 3 months for as long as you are taking PrEP. Pregnancy  If you are premenopausal and you may become pregnant, ask your health care provider about preconception counseling.  If you may become pregnant, take 400 to 800 micrograms (mcg) of folic acid every day.  If you want to prevent pregnancy, talk to your health care provider about birth control (contraception). Osteoporosis and menopause  Osteoporosis is a disease in which the bones lose minerals and strength with aging. This can result in serious bone fractures. Your risk for osteoporosis can be identified using a bone density scan.  If you are 6 years of age or older, or if you are at risk for osteoporosis and fractures, ask your  health care provider if you should be screened.  Ask your health care provider whether you should take a calcium or vitamin D supplement to lower your risk for osteoporosis.  Menopause may have certain physical symptoms and risks.  Hormone replacement therapy may reduce some of these symptoms and risks. Talk to your health care provider about whether hormone replacement therapy is right for you. Follow these instructions at home:  Schedule regular health, dental, and eye exams.  Stay current with your immunizations.  Do not use any tobacco products including cigarettes, chewing tobacco, or electronic cigarettes.  If you are pregnant, do not drink alcohol.  If you are breastfeeding, limit how much and how often you drink alcohol.  Limit alcohol intake to no more than 1 drink per day for nonpregnant women. One drink equals 12 ounces of beer, 5 ounces of wine, or 1 ounces of hard liquor.  Do not use street drugs.  Do not share needles.  Ask your health care provider for help if you need support or information about quitting drugs.  Tell your health care provider if you often feel depressed.  Tell your health care provider if you have ever been abused or do not feel safe at home. This information is not intended to replace advice given to you by your health care provider. Make sure you discuss any questions you have with your health care provider. Document Released: 10/12/2010 Document Revised: 09/04/2015 Document Reviewed: 12/31/2014 Elsevier Interactive Patient Education  [employer].

## 2017-08-18 ENCOUNTER — Encounter: Payer: Self-pay | Admitting: Obstetrics & Gynecology

## 2017-08-18 ENCOUNTER — Ambulatory Visit: Payer: 59 | Admitting: Obstetrics & Gynecology

## 2017-08-18 VITALS — BP 132/88

## 2017-08-18 DIAGNOSIS — Z86002 Personal history of in-situ neoplasm of other and unspecified genital organs: Secondary | ICD-10-CM

## 2017-08-18 DIAGNOSIS — Z86008 Personal history of in-situ neoplasm of other site: Secondary | ICD-10-CM

## 2017-08-18 DIAGNOSIS — N9089 Other specified noninflammatory disorders of vulva and perineum: Secondary | ICD-10-CM

## 2017-08-18 DIAGNOSIS — Z85048 Personal history of other malignant neoplasm of rectum, rectosigmoid junction, and anus: Secondary | ICD-10-CM

## 2017-08-18 DIAGNOSIS — Z8544 Personal history of malignant neoplasm of other female genital organs: Secondary | ICD-10-CM | POA: Diagnosis not present

## 2017-08-18 DIAGNOSIS — K6282 Dysplasia of anus: Secondary | ICD-10-CM | POA: Diagnosis not present

## 2017-08-18 NOTE — Progress Notes (Signed)
    Maureen Wall [birth year] [id number]        47 y.o.  G3P0030  Married.  H/O Ectopic pregnancies/s/p Bilateral Salpingectomy.  1 adopted son.  RP: Vulvar/perianal Colposcopy for h/o VIN 3 and AIN 3  HPI: No complaint.  Pap negative/HPV HR neg 07/19/2017.Last Colpo negative 10/2016 with Dr Toney Rakes.  Dr. Everitt Amber  performeda wide local excision of the left posterior labia majoraand CO2 laser ablation of the anterior perianal tissues on 6/57/84ONGEXBM complications. Her postoperative course was uncomplicated. Her final pathology revealedVIN3 of the left labia majora.  OB History  Gravida Para Term Preterm AB Living  3 0     3 0  SAB TAB Ectopic Multiple Live Births  1   2        # Outcome Date GA Lbr Len/2nd Weight Sex Delivery Anes PTL Lv  3 Ectopic           2 Ectopic           1 SAB             Past medical history,surgical history, problem list, medications, allergies, family history and social history were all reviewed and documented in the EPIC chart.   Directed ROS with pertinent positives and negatives documented in the history of present illness/assessment and plan.  Exam:  Vitals:   08/18/17 1523  BP: 132/88   General appearance:  Normal  Colposcopy Procedure Note Maureen Wall 08/18/2017  Indications: H/O VIN 3/AIN 3 for Vulvar/Perianal Colposcopy  Procedure Details  The risks and benefits of the procedure and Verbal informed consent obtained.  Vulva and perianal area swabs with Acetic Acid x 3.  Findings:  Acetowhite with punctation at sites of biopsies.  At left mid vulva, also increased pigmentation.  Betadine prep and local infiltration of Lidocaine 1% before performing the biopsies.  Vulvar and perianal colposcopy:  Physical Exam  Genitourinary:       Specimens: Biopsy of left mid labia minora and left perianal area  Complications: None.  Both Bx sites closed with separate stitches of Vicryl 4-0.  Good hemostasis. . Plan:  Pending  patho.  Plan per pathology results.   Assessment/Plan:  47 y.o. G3P0030   1. 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 Denman Wall.  Colposcopy of the vulva and perianal area today revealing 2 areas of possible dysplasia.  Pending pathology results.  Management per results.  Recommend smoking cessation and folic acid supplements 800 mcg daily.  Counseling on above issues and coordination of care more than 50% for 15 minutes.  Maureen Bruins MD, 3:35 PM 08/18/2017

## 2017-08-19 ENCOUNTER — Encounter: Payer: Self-pay | Admitting: Obstetrics & Gynecology

## 2017-08-19 NOTE — Patient Instructions (Signed)
1. 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 Denman George.  Colposcopy of the vulva and perianal area today revealing 2 areas of possible dysplasia.  Pending pathology results.  Management per results.  Recommend smoking cessation and folic acid supplements 800 mcg daily.  Prudence, good seeing you today!  I will inform you of your results as soon as they are available.   Vulva Biopsy, Care After Refer to this sheet in the next few weeks. These instructions provide you with information about caring for yourself after your procedure. Your health care provider may also give you more specific instructions. Your treatment has been planned according to current medical practices, but problems sometimes occur. Call your health care provider if you have any problems or questions after your procedure. What can I expect after the procedure? After the procedure, it is common to have:  Slight bleeding from the biopsy site.  Discomfort at the biopsy site.  Follow these instructions at home: Biopsy Site Care   Do not rub the biopsy area after urinating. Gently pat the area dry or use a bottle filled with warm water (peri-bottle) to clean the area. Gently wipe from front to back.  Follow instructions from your health care provider about how to take care of your biopsy site. Make sure you: ? Clean the area using water and mild soap twice a day or as told by your health care provider. Gently pat the area dry. ? If you were prescribed an antibiotic medical ointment, apply it as told by your health care provider. Do not stop using the antibiotic even if your condition improves. ? Take a warm water bath that is taken while you are sitting down (sitz bath) as needed to help with pain or discomfort. ? Leave stitches (sutures), skin glue, or adhesive strips in place. These skin  closures may need to stay in place for 2 weeks or longer. If adhesive strip edges start to loosen and curl up, you may trim the loose edges. Do not remove adhesive strips completely unless your health care provider tells you to do that.  Check your biopsy site every day for signs of infection. Check for: ? More redness, swelling, or pain. ? More fluid or blood. ? Warmth. ? Pus or a bad smell. Lifestyle  Wear loose, cotton underwear. Do not wear tight pants.  Do not use a tampon, douche, or put anything inside your vagina for at least one week or until your health care provider approves.  Do not have sex for at least one week or until your health care provider approves.  Do not exercise, such as running or biking, until your health care provider approves.  Do not take baths, swim, or use a hot tub until your health care provider approves. General instructions  Take over-the-counter and prescription medicines only as told by your health care provider.  Use a sanitary napkin until bleeding stops.  Keep all follow-up visits as told by your health care provider. This is important.  If the sample is being sent for testing, it is your responsibility to get the results of your procedure. Ask your health care provider or the department performing the procedure when your results will be ready. Contact a health care provider if:  You have more redness, swelling, or pain around your biopsy site.  You have more fluid or blood coming from your  biopsy site.  Your biopsy site feels warm to the touch.  Your pain is not controlled with medicine. Get help right away if:  You have heavy bleeding from the vulva.  You have pus or a bad smell coming from your biopsy site.  You have a fever.  You have lower belly pain. This information is not intended to replace advice given to you by your health care provider. Make sure you discuss any questions you have with your health care  provider. Document Released: 03/15/2012 Document Revised: 09/04/2015 Document Reviewed: 02/17/2015 Elsevier Interactive Patient Education  [employer].

## 2017-08-22 LAB — TISSUE PATH REPORT

## 2017-08-22 LAB — PATHOLOGY

## 2017-08-24 NOTE — Addendum Note (Signed)
Addended by: Alen Blew on: 08/24/2017 11:47 AM   Modules accepted: Level of Service

## 2017-08-29 ENCOUNTER — Telehealth: Payer: Self-pay | Admitting: *Deleted

## 2017-08-29 DIAGNOSIS — K6282 Dysplasia of anus: Secondary | ICD-10-CM

## 2017-08-29 NOTE — Telephone Encounter (Signed)
-----   Message from Ramond Craver, [place] sent at 08/24/2017  9:40 AM EDT ----- Regarding: Dr. Denman George referral Per Dr. Marguerita Merles "AIN 2.  Refer back to Dr Denman George who treated her in the past."  Patient informed.

## 2017-08-29 NOTE — Telephone Encounter (Signed)
I sent message to Dr. Denman George Nurse Lenna Sciara) via staff message and after Dr. Denman George reviewed records she recommended patient schedule with general surgeon, patient schedule with Dr. Marcello Moores on 09/13/17 @  10:30am pt aware.

## 2017-09-26 ENCOUNTER — Ambulatory Visit: Payer: Self-pay | Admitting: General Surgery

## 2017-09-26 NOTE — H&P (Signed)
History of Present Illness Leighton Ruff MD; 07/24/2438 3:44 PM) The patient is a 47 year old female who presents with anal lesions. 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 This was biopsied and showed AIN grade 2. She was referred to [place] for further evaluation. She denies any symptoms. She denies any history of cervical cancer or dysplasia.   Past Surgical History Sabino Gasser; 09/26/2017 3:24 PM) Oral Surgery  Diagnostic Studies History Sabino Gasser; 09/26/2017 3:24 PM) Colonoscopy never Mammogram within last year Pap Smear 1-5 years ago  Allergies Sabino Gasser; 09/26/2017 3:28 PM) Aricept *PSYCHOTHERAPEUTIC AND NEUROLOGICAL AGENTS - MISC.* Morphine Sulfate (Concentrate) *ANALGESICS - OPIOID* Allergies Reconciled  Medication History Sabino Gasser; 09/26/2017 3:28 PM) No Current Medications Medications Reconciled  Social History Sabino Gasser; 09/26/2017 3:24 PM) Alcohol use Occasional alcohol use. Caffeine use Carbonated beverages, Coffee, Tea. Illicit drug use Remotely quit drug use. Tobacco use Current every day smoker.  Family History Sabino Gasser; 09/26/2017 3:24 PM) Depression Mother, Sister. Diabetes Mellitus Brother, Mother, Sister. Hypertension Mother.  Pregnancy / Birth History Sabino Gasser; 09/26/2017 3:24 PM) Age at menarche 1 years. Contraceptive History Oral contraceptives. Gravida 2 Irregular periods Maternal age 37-20 Para 0  Other Problems Sabino Gasser; 09/26/2017 3:24 PM) Back Pain Hypercholesterolemia     Review of Systems Sabino Gasser; 09/26/2017 3:24 PM) General Present- Weight Gain. Not Present- Appetite Loss, Chills, Fatigue, Fever, Night Sweats and Weight Loss. HEENT Present- Wears glasses/contact lenses. Not Present- Earache, Hearing Loss, Hoarseness, Nose Bleed, Oral Ulcers, Ringing in the Ears, Seasonal Allergies, Sinus Pain,  Sore Throat, Visual Disturbances and Yellow Eyes. Respiratory Not Present- Bloody sputum, Chronic Cough, Difficulty Breathing, Snoring and Wheezing. Breast Not Present- Breast Mass, Breast Pain, Nipple Discharge and Skin Changes. Cardiovascular Not Present- Chest Pain, Difficulty Breathing Lying Down, Leg Cramps, Palpitations, Rapid Heart Rate, Shortness of Breath and Swelling of Extremities. Female Genitourinary Not Present- Frequency, Nocturia, Painful Urination, Pelvic Pain and Urgency. Musculoskeletal Present- Swelling of Extremities. Not Present- Back Pain, Joint Pain, Joint Stiffness, Muscle Pain and Muscle Weakness. Neurological Not Present- Decreased Memory, Fainting, Headaches, Numbness, Seizures, Tingling, Tremor, Trouble walking and Weakness. Psychiatric Not Present- Anxiety, Bipolar, Change in Sleep Pattern, Depression, Fearful and Frequent crying. Endocrine Present- Hot flashes. Not Present- Cold Intolerance, Excessive Hunger, Hair Changes, Heat Intolerance and New Diabetes. Hematology Not Present- Blood Thinners, Easy Bruising, Excessive bleeding, Gland problems, HIV and Persistent Infections.  Vitals Sabino Gasser; 09/26/2017 3:29 PM) 09/26/2017 3:28 PM Weight: 203.25 lb Height: 62in Body Surface Area: 1.92 m Body Mass Index: 37.17 kg/m  Temp.: 98.68F(Oral)  Pulse: 94 (Regular)  BP: 132/84 (Sitting, Left Arm, Standard)      Physical Exam Leighton Ruff MD; 04/14/7251 3:54 PM)  General Mental Status-Alert. General Appearance-Not in acute distress. Build & Nutrition-Well nourished. Posture-Normal posture. Gait-Normal.  Head and Neck Head-normocephalic, atraumatic with no lesions or palpable masses. Trachea-midline.  Chest and Lung Exam Chest and lung exam reveals -on auscultation, normal breath sounds, no adventitious sounds and normal vocal resonance.  Cardiovascular Cardiovascular examination reveals -normal heart sounds, regular  rate and rhythm with no murmurs and no digital clubbing, cyanosis, edema, increased warmth or tenderness.  Abdomen Inspection Inspection of the abdomen reveals - No Hernias. Palpation/Percussion Palpation and Percussion of the abdomen reveal - Soft, Non Tender, No Rigidity (guarding), No hepatosplenomegaly and No Palpable abdominal masses.  Rectal Anorectal Exam External - warts . Internal - normal sphincter tone.  Neurologic Neurologic evaluation reveals -alert and oriented x 3 with no impairment of recent or remote memory, normal attention span and ability to concentrate, normal sensation and normal coordination.  Musculoskeletal Normal Exam - Bilateral-Upper Extremity Strength Normal and Lower Extremity Strength Normal.   Results Leighton Ruff MD; 1/63/8466 3:43 PM) Procedures  Name Value Date ANOSCOPY, DIAGNOSTIC (59935) [ Hemorrhoids ] Procedure Other: Procedure: Anoscopy....Marland KitchenMarland KitchenSurgeon: Marcello Moores....Marland KitchenMarland KitchenAfter the risks and benefits were explained, verbal consent was obtained for above procedure. A medical assistant chaperone was present thoroughout the entire procedure. 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 On exam she does have a few small focal lesions. We have discussed her options including monitoring in the office versus anal mapping with high-resolution anoscopy in the operating room. We have discussed that neither option seems to decrease her risk of developing anal cancer. She has elected to proceed with anal mapping and high-resolution anoscopy.

## 2017-12-02 ENCOUNTER — Encounter (HOSPITAL_BASED_OUTPATIENT_CLINIC_OR_DEPARTMENT_OTHER): Payer: Self-pay

## 2017-12-06 ENCOUNTER — Encounter (HOSPITAL_BASED_OUTPATIENT_CLINIC_OR_DEPARTMENT_OTHER): Payer: Self-pay | Admitting: *Deleted

## 2017-12-06 ENCOUNTER — Other Ambulatory Visit: Payer: Self-pay

## 2017-12-06 NOTE — Progress Notes (Signed)
Spoke w/ pt via phone for pre-op interview.  Npo after mn.   Arrive at 0530.

## 2017-12-09 ENCOUNTER — Ambulatory Visit (HOSPITAL_BASED_OUTPATIENT_CLINIC_OR_DEPARTMENT_OTHER)
Admission: RE | Admit: 2017-12-09 | Discharge: 2017-12-09 | Disposition: A | Discharge status: Home / Self Care | Payer: 59 | Source: Ambulatory Visit | Attending: General Surgery | Admitting: General Surgery

## 2017-12-09 ENCOUNTER — Other Ambulatory Visit: Payer: Self-pay

## 2017-12-09 ENCOUNTER — Encounter (HOSPITAL_BASED_OUTPATIENT_CLINIC_OR_DEPARTMENT_OTHER)
Admission: RE | Disposition: A | Discharge status: Home / Self Care | Payer: Self-pay | Source: Ambulatory Visit | Attending: General Surgery

## 2017-12-09 ENCOUNTER — Encounter (HOSPITAL_BASED_OUTPATIENT_CLINIC_OR_DEPARTMENT_OTHER): Payer: Self-pay | Admitting: *Deleted

## 2017-12-09 ENCOUNTER — Ambulatory Visit (HOSPITAL_BASED_OUTPATIENT_CLINIC_OR_DEPARTMENT_OTHER): Payer: 59 | Admitting: Anesthesiology

## 2017-12-09 DIAGNOSIS — Z818 Family history of other mental and behavioral disorders: Secondary | ICD-10-CM | POA: Insufficient documentation

## 2017-12-09 DIAGNOSIS — D013 Carcinoma in situ of anus and anal canal: Secondary | ICD-10-CM | POA: Insufficient documentation

## 2017-12-09 DIAGNOSIS — Z885 Allergy status to narcotic agent status: Secondary | ICD-10-CM | POA: Insufficient documentation

## 2017-12-09 DIAGNOSIS — K6282 Dysplasia of anus: Secondary | ICD-10-CM | POA: Diagnosis present

## 2017-12-09 DIAGNOSIS — F17200 Nicotine dependence, unspecified, uncomplicated: Secondary | ICD-10-CM | POA: Diagnosis not present

## 2017-12-09 DIAGNOSIS — A630 Anogenital (venereal) warts: Secondary | ICD-10-CM | POA: Diagnosis not present

## 2017-12-09 DIAGNOSIS — Z8249 Family history of ischemic heart disease and other diseases of the circulatory system: Secondary | ICD-10-CM | POA: Diagnosis not present

## 2017-12-09 DIAGNOSIS — F419 Anxiety disorder, unspecified: Secondary | ICD-10-CM | POA: Diagnosis not present

## 2017-12-09 DIAGNOSIS — E7800 Pure hypercholesterolemia, unspecified: Secondary | ICD-10-CM | POA: Insufficient documentation

## 2017-12-09 DIAGNOSIS — M549 Dorsalgia, unspecified: Secondary | ICD-10-CM | POA: Insufficient documentation

## 2017-12-09 DIAGNOSIS — Z833 Family history of diabetes mellitus: Secondary | ICD-10-CM | POA: Diagnosis not present

## 2017-12-09 HISTORY — DX: Other specified abnormal uterine and vaginal bleeding: N93.8

## 2017-12-09 HISTORY — PX: HIGH RESOLUTION ANOSCOPY: SHX6345

## 2017-12-09 SURGERY — ANOSCOPY, HIGH RESOLUTION
Anesthesia: Monitor Anesthesia Care | Site: Rectum

## 2017-12-09 MED ORDER — ACETAMINOPHEN 500 MG PO TABS
1000.0000 mg | ORAL_TABLET | ORAL | Status: AC
  Administered 2017-12-09: 1000 mg via ORAL
  Filled 2017-12-09: qty 2

## 2017-12-09 MED ORDER — MIDAZOLAM HCL 2 MG/2ML IJ SOLN
INTRAMUSCULAR | Status: AC
  Filled 2017-12-09: qty 2

## 2017-12-09 MED ORDER — LIDOCAINE 2% (20 MG/ML) 5 ML SYRINGE
INTRAMUSCULAR | Status: AC
  Filled 2017-12-09: qty 5

## 2017-12-09 MED ORDER — ONDANSETRON HCL 4 MG/2ML IJ SOLN
INTRAMUSCULAR | Status: AC
  Filled 2017-12-09: qty 2

## 2017-12-09 MED ORDER — LACTATED RINGERS IV SOLN
INTRAVENOUS | Status: DC
  Administered 2017-12-09: 06:00:00 via INTRAVENOUS
  Filled 2017-12-09: qty 1000

## 2017-12-09 MED ORDER — BUPIVACAINE-EPINEPHRINE 0.5% -1:200000 IJ SOLN
INTRAMUSCULAR | Status: DC | PRN
  Administered 2017-12-09: 20 mL

## 2017-12-09 MED ORDER — LIDOCAINE 2% (20 MG/ML) 5 ML SYRINGE
INTRAMUSCULAR | Status: DC | PRN
  Administered 2017-12-09: 25 mg via INTRAVENOUS

## 2017-12-09 MED ORDER — GABAPENTIN 300 MG PO CAPS
300.0000 mg | ORAL_CAPSULE | ORAL | Status: AC
  Administered 2017-12-09: 300 mg via ORAL
  Filled 2017-12-09: qty 1

## 2017-12-09 MED ORDER — GABAPENTIN 300 MG PO CAPS
ORAL_CAPSULE | ORAL | Status: AC
  Filled 2017-12-09: qty 1

## 2017-12-09 MED ORDER — ONDANSETRON HCL 4 MG/2ML IJ SOLN
INTRAMUSCULAR | Status: DC | PRN
  Administered 2017-12-09: 4 mg via INTRAVENOUS

## 2017-12-09 MED ORDER — PROPOFOL 500 MG/50ML IV EMUL
INTRAVENOUS | Status: DC | PRN
  Administered 2017-12-09: 200 ug/kg/min via INTRAVENOUS

## 2017-12-09 MED ORDER — KETAMINE HCL 10 MG/ML IJ SOLN
INTRAMUSCULAR | Status: DC | PRN
  Administered 2017-12-09: 10 mg via INTRAVENOUS

## 2017-12-09 MED ORDER — MIDAZOLAM HCL 2 MG/2ML IJ SOLN
INTRAMUSCULAR | Status: DC | PRN
  Administered 2017-12-09: 2 mg via INTRAVENOUS

## 2017-12-09 MED ORDER — ACETIC ACID 5 % SOLN
Status: DC | PRN
  Administered 2017-12-09: 1 via TOPICAL

## 2017-12-09 MED ORDER — FENTANYL CITRATE (PF) 100 MCG/2ML IJ SOLN
INTRAMUSCULAR | Status: AC
  Filled 2017-12-09: qty 2

## 2017-12-09 MED ORDER — DEXAMETHASONE SODIUM PHOSPHATE 10 MG/ML IJ SOLN
INTRAMUSCULAR | Status: AC
  Filled 2017-12-09: qty 1

## 2017-12-09 MED ORDER — HYDROCODONE-ACETAMINOPHEN 5-325 MG PO TABS: 1.0000 | ORAL_TABLET | ORAL | 0 refills | Status: DC | PRN

## 2017-12-09 MED ORDER — IODINE STRONG (LUGOLS) 5 % PO SOLN
ORAL | Status: DC | PRN
  Administered 2017-12-09: 10 mL via ORAL

## 2017-12-09 MED ORDER — ACETAMINOPHEN 500 MG PO TABS
ORAL_TABLET | ORAL | Status: AC
  Filled 2017-12-09: qty 2

## 2017-12-09 MED ORDER — KETAMINE HCL 10 MG/ML IJ SOLN
INTRAMUSCULAR | Status: AC
  Filled 2017-12-09: qty 1

## 2017-12-09 MED ORDER — DEXAMETHASONE SODIUM PHOSPHATE 10 MG/ML IJ SOLN
INTRAMUSCULAR | Status: DC | PRN
  Administered 2017-12-09: 10 mg via INTRAVENOUS

## 2017-12-09 MED ORDER — FENTANYL CITRATE (PF) 100 MCG/2ML IJ SOLN
INTRAMUSCULAR | Status: DC | PRN
  Administered 2017-12-09: 50 ug via INTRAVENOUS

## 2017-12-09 SURGICAL SUPPLY — 40 items
BENZOIN TINCTURE PRP APPL 2/3 (GAUZE/BANDAGES/DRESSINGS) ×3 IMPLANT
BLADE EXTENDED COATED 6.5IN (ELECTRODE) IMPLANT
BLADE HEX COATED 2.75 (ELECTRODE) ×3 IMPLANT
BLADE SURG 15 STRL LF DISP TIS (BLADE) ×1 IMPLANT
BLADE SURG 15 STRL SS (BLADE) ×2
BRIEF STRETCH FOR OB PAD LRG (UNDERPADS AND DIAPERS) ×3 IMPLANT
CANISTER SUCT 3000ML PPV (MISCELLANEOUS) ×3 IMPLANT
COVER BACK TABLE 60X90IN (DRAPES) ×3 IMPLANT
DECANTER SPIKE VIAL GLASS SM (MISCELLANEOUS) ×3 IMPLANT
DRAPE LAPAROTOMY 100X72 PEDS (DRAPES) ×3 IMPLANT
DRAPE UTILITY XL STRL (DRAPES) ×3 IMPLANT
ELECT REM PT RETURN 9FT ADLT (ELECTROSURGICAL) ×3
ELECTRODE REM PT RTRN 9FT ADLT (ELECTROSURGICAL) ×1 IMPLANT
GAUZE 4X4 16PLY RFD (DISPOSABLE) IMPLANT
GAUZE SPONGE 4X4 12PLY STRL (GAUZE/BANDAGES/DRESSINGS) ×3 IMPLANT
GLOVE BIO SURGEON STRL SZ 6.5 (GLOVE) ×4 IMPLANT
GLOVE BIO SURGEONS STRL SZ 6.5 (GLOVE) ×2
GLOVE BIOGEL PI IND STRL 6.5 (GLOVE) ×1 IMPLANT
GLOVE BIOGEL PI IND STRL 7.5 (GLOVE) ×1 IMPLANT
GLOVE BIOGEL PI INDICATOR 6.5 (GLOVE) ×2
GLOVE BIOGEL PI INDICATOR 7.5 (GLOVE) ×2
KIT TURNOVER CYSTO (KITS) ×3 IMPLANT
NEEDLE HYPO 22GX1.5 SAFETY (NEEDLE) ×3 IMPLANT
NS IRRIG 500ML POUR BTL (IV SOLUTION) ×3 IMPLANT
PACK BASIN DAY SURGERY FS (CUSTOM PROCEDURE TRAY) ×3 IMPLANT
PAD ABD 8X10 STRL (GAUZE/BANDAGES/DRESSINGS) ×3 IMPLANT
PAD ARMBOARD 7.5X6 YLW CONV (MISCELLANEOUS) IMPLANT
PENCIL BUTTON HOLSTER BLD 10FT (ELECTRODE) ×3 IMPLANT
SPONGE SURGIFOAM ABS GEL 12-7 (HEMOSTASIS) IMPLANT
SUT CHROMIC 2 0 SH (SUTURE) ×3 IMPLANT
SUT CHROMIC 3 0 SH 27 (SUTURE) ×3 IMPLANT
SUT VIC AB 4-0 P-3 18XBRD (SUTURE) IMPLANT
SUT VIC AB 4-0 P3 18 (SUTURE)
SYR BULB IRRIGATION 50ML (SYRINGE) ×3 IMPLANT
SYR CONTROL 10ML LL (SYRINGE) ×3 IMPLANT
TOWEL OR 17X24 6PK STRL BLUE (TOWEL DISPOSABLE) ×6 IMPLANT
TRAY DSU PREP LF (CUSTOM PROCEDURE TRAY) ×3 IMPLANT
TUBE CONNECTING 12'X1/4 (SUCTIONS) ×1
TUBE CONNECTING 12X1/4 (SUCTIONS) ×2 IMPLANT
YANKAUER SUCT BULB TIP NO VENT (SUCTIONS) IMPLANT

## 2017-12-09 NOTE — H&P (Signed)
The patient is a 47 year old female who presents with anal lesions. 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 This was biopsied and showed AIN grade 2. She was referred to [place] for further evaluation. She denies any symptoms. She denies any history of cervical cancer or dysplasia.   Past Surgical History Sabino Gasser; 09/26/2017 3:24 PM) Oral Surgery  Diagnostic Studies History Sabino Gasser; 09/26/2017 3:24 PM) Colonoscopy never Mammogram within last year Pap Smear 1-5 years ago  Allergies Sabino Gasser; 09/26/2017 3:28 PM) Aricept *PSYCHOTHERAPEUTIC AND NEUROLOGICAL AGENTS - MISC.* Morphine Sulfate (Concentrate) *ANALGESICS - OPIOID* Allergies Reconciled  Medication History Sabino Gasser; 09/26/2017 3:28 PM) No Current Medications Medications Reconciled  Social History Sabino Gasser; 09/26/2017 3:24 PM) Alcohol use Occasional alcohol use. Caffeine use Carbonated beverages, Coffee, Tea. Illicit drug use Remotely quit drug use. Tobacco use Current every day smoker.  Family History Sabino Gasser; 09/26/2017 3:24 PM) Depression Mother, Sister. Diabetes Mellitus Brother, Mother, Sister. Hypertension Mother.  Pregnancy / Birth History Sabino Gasser; 09/26/2017 3:24 PM) Age at menarche 31 years. Contraceptive History Oral contraceptives. Gravida 2 Irregular periods Maternal age 58-20 Para 0  Other Problems Sabino Gasser; 09/26/2017 3:24 PM) Back Pain Hypercholesterolemia     Review of Systems  General Present- Weight Gain. Not Present- Appetite Loss, Chills, Fatigue, Fever, Night Sweats and Weight Loss. HEENT Present- Wears glasses/contact lenses. Not Present- Earache, Hearing Loss, Hoarseness, Nose Bleed, Oral Ulcers, Ringing in the Ears, Seasonal Allergies, Sinus Pain, Sore Throat, Visual Disturbances and Yellow Eyes. Respiratory Not Present- Bloody sputum,  Chronic Cough, Difficulty Breathing, Snoring and Wheezing. Breast Not Present- Breast Mass, Breast Pain, Nipple Discharge and Skin Changes. Cardiovascular Not Present- Chest Pain, Difficulty Breathing Lying Down, Leg Cramps, Palpitations, Rapid Heart Rate, Shortness of Breath and Swelling of Extremities. Female Genitourinary Not Present- Frequency, Nocturia, Painful Urination, Pelvic Pain and Urgency. Musculoskeletal Present- Swelling of Extremities. Not Present- Back Pain, Joint Pain, Joint Stiffness, Muscle Pain and Muscle Weakness. Neurological Not Present- Decreased Memory, Fainting, Headaches, Numbness, Seizures, Tingling, Tremor, Trouble walking and Weakness. Psychiatric Not Present- Anxiety, Bipolar, Change in Sleep Pattern, Depression, Fearful and Frequent crying. Endocrine Present- Hot flashes. Not Present- Cold Intolerance, Excessive Hunger, Hair Changes, Heat Intolerance and New Diabetes. Hematology Not Present- Blood Thinners, Easy Bruising, Excessive bleeding, Gland problems, HIV and Persistent Infections.  BP (!) 158/105   Pulse 75   Temp 97.8 F (36.6 C) (Oral)   Resp 18   Ht 5' 2.25" (1.581 m)   Wt 92.9 kg   LMP 11/15/2017 (Approximate)   SpO2 98%   BMI 37.16 kg/m    Physical Exam   General Mental Status-Alert. General Appearance-Not in acute distress. Build & Nutrition-Well nourished. Posture-Normal posture. Gait-Normal.  Head and Neck Head-normocephalic, atraumatic with no lesions or palpable masses. Trachea-midline.  Chest and Lung Exam Chest and lung exam reveals -on auscultation, normal breath sounds, no adventitious sounds and normal vocal resonance.  Cardiovascular Cardiovascular examination reveals -normal heart sounds, regular rate and rhythm with no murmurs and no digital clubbing, cyanosis, edema, increased warmth or tenderness.  Abdomen Inspection Inspection of the abdomen reveals - No  Hernias. Palpation/Percussion Palpation and Percussion of the abdomen reveal - Soft, Non Tender, No Rigidity (guarding), No hepatosplenomegaly and No Palpable abdominal masses.  Rectal Anorectal Exam External - warts . Internal - normal sphincter tone.  Neurologic Neurologic evaluation reveals -alert and oriented x 3 with no impairment of recent  or remote memory, normal attention span and ability to concentrate, normal sensation and normal coordination.  ANOSCOPY, DIAGNOSTIC (89211) [ Hemorrhoids ] Procedure Other: Procedure: Anoscopy....Marland KitchenMarland KitchenSurgeon: Marcello Moores....Marland KitchenMarland KitchenAfter the risks and benefits were explained, verbal consent was obtained for above procedure. A medical assistant chaperone was present thoroughout the entire procedure. ....Marland KitchenMarland KitchenAnesthesia: none....Marland KitchenMarland KitchenDiagnosis: AIN 2....Marland KitchenMarland KitchenFindings: Area of concern in the right anterior anal canal, left lateral perianal region  Performed: 09/26/2017 3:42 PM    Assessment & Plan   AIN GRADE II (H41.74) Impression: 47 year old female who presents to the office for evaluation after a perianal biopsy by her gynecologist revealed AIN 2. On exam she does have a few small focal lesions. We have discussed her options including monitoring in the office versus anal mapping with high-resolution anoscopy in the operating room. We have discussed that neither option seems to decrease her risk of developing anal cancer. She has elected to proceed with anal mapping and high-resolution anoscopy.

## 2017-12-09 NOTE — Transfer of Care (Signed)
Immediate Anesthesia Transfer of Care Note  Patient: Pincus Large  Procedure(s) Performed: HIGH RESOLUTION ANOSCOPY WITH  BIOPSY (N/A Rectum)  Patient Location: PACU  Anesthesia Type:MAC  Level of Consciousness: awake, alert , oriented and patient cooperative  Airway & Oxygen Therapy: Patient Spontanous Breathing and Patient connected to nasal cannula oxygen  Post-op Assessment: Report given to RN and Post -op Vital signs reviewed and stable  Post vital signs: Reviewed and stable  Last Vitals:  Vitals Value Taken Time  BP    Temp    Pulse 77 12/09/2017  8:15 AM  Resp    SpO2 98 % 12/09/2017  8:15 AM  Vitals shown include unvalidated device data.  Last Pain:  Vitals:   12/09/17 0604  TempSrc:   PainSc: 0-No pain      Patients Stated Pain Goal: 7 (07/68/08 8110)  Complications: No apparent anesthesia complications

## 2017-12-09 NOTE — Discharge Instructions (Addendum)
Post Operative Instructions Following Laser Surgery  Laser treatment of condyloma (warts) is used to vaporize or eliminate the wart.  The laser actually creates a burn effect on the skin to accomplish this.  The following instructions will help in you comfort postoperatively:  First 24 h  Remove the dressing this evening after you return home from the hospital  Sit in a tub or sitz bath of warm water for 20 minutes every few hours while awake.  After the bath, carefully blot the area dry and place a piece of 100% Cotton with Lidocaine ointment on it next to the anal opening.  You may sit on a covered ice pack between the baths as needed.    You should have crushed ice in small amounts until you are able to urinate.  After you urinate, you may drink fluids.  It is normal to not urinate for the first few hours after surgery.  If you become uncomfortable, call the office for instructions.   Take your pain medications as prescribed  You may eat whatever you feel like.  Start with a light meal and gradually advance your diet.    Beginning the day after your surgery  Sit in a tub of cool to warm water for 15 minutes at least 3 times a day and after bowel movements  After the bowel movement, clean with wet cotton, Tucks or baby wipes.  Apply Silvadene to a thin piece of cotton and place over the anal opening after your baths.  This will collect any drainage or bleeding.  Drainage is usually a pink to tan color and is normal for the following 2-3 weeks after surgery.  Change to cotton ever 2-3 hours while awake.  Diet  Eat a regular diet.  Avoid foods that may constipate you or give you diarrhea.  Avoid foods with seeds, nuts, corn or popcorn.  Beginning the day after surgery, drink 6-8 glasses of water a day in addition to your meals.  Limit you caffeine intake to 1-2 servings per day.  Medication  Take a fiber supplement (Metamucil, Citrucel, FiberCon) twice a day.  Take a stool softener  like Colace twice daily  Take your pain medication as directed.  You may use Extra Strength Tylenol instead of your prescribed pain medication to relieve mild discomfort.  Avoid products containing aspirin.  Bowel Habits  You should have a bowel movement at least every other day.  If you are constipated, you may take a Fleet enema or 2 Dulcolax tablets.  Call the office if no results occur.  You may bear down a normal amount to have a bowel movement without hurting your tissues after the operation.  Activity  Walking is encouraged.  You may drive when you are no longer on prescription pain medication.  You may go up and down stairs carefully.  No heavy lifting or strenuous activity until after your first post operative appointment  Do NOT sit on a rubber ring; instead use a soft pillow if needed.  Call the office if you have any questions or concerns.  Call IMMEDIATELY if you develop:  Rectal bleeding  Increasing rectal pain  Fever greater than 100 F  Difficulty urinating   Post Anesthesia Home Care Instructions  Activity: Get plenty of rest for the remainder of the day. A responsible individual must stay with you for 24 hours following the procedure.  For the next 24 hours, DO NOT: -Drive a car -Paediatric nurse -Drink alcoholic beverages -Take any  medication unless instructed by your physician -Make any legal decisions or sign important papers.  Meals: Start with liquid foods such as gelatin or soup. Progress to regular foods as tolerated. Avoid greasy, spicy, heavy foods. If nausea and/or vomiting occur, drink only clear liquids until the nausea and/or vomiting subsides. Call your physician if vomiting continues.  Special Instructions/Symptoms: Your throat may feel dry or sore from the anesthesia or the breathing tube placed in your throat during surgery. If this causes discomfort, gargle with warm salt water. The discomfort should disappear within 24 hours.  If you  had a scopolamine patch placed behind your ear for the management of post- operative nausea and/or vomiting:  1. The medication in the patch is effective for 72 hours, after which it should be removed.  Wrap patch in a tissue and discard in the trash. Wash hands thoroughly with soap and water. 2. You may remove the patch earlier than 72 hours if you experience unpleasant side effects which may include dry mouth, dizziness or visual disturbances. 3. Avoid touching the patch. Wash your hands with soap and water after contact with the patch.

## 2017-12-09 NOTE — Op Note (Addendum)
12/09/2017  8:08 AM  PATIENT:  Maureen Wall  47 y.o. female  Patient Care Team: Enid Skeens., MD as PCP - General (Family Medicine)  PRE-OPERATIVE DIAGNOSIS:  AIN II  POST-OPERATIVE DIAGNOSIS:  AIN II  PROCEDURE:  HIGH RESOLUTION ANOSCOPY WITH  BIOPSY, EXCISION OF PERIANAL LESION    Surgeon(s): Leighton Ruff, MD  ASSISTANT: none   ANESTHESIA:   local and MAC  EBL: 48m  No intake/output data recorded.  SPECIMEN:  Source of Specimen:  R lateral anal canal, L lateral anal canal, L lateral perianal area  DISPOSITION OF SPECIMEN:  PATHOLOGY  COUNTS:  YES  PLAN OF CARE: Discharge to home after PACU  PATIENT DISPOSITION:  PACU - hemodynamically stable.  INDICATION: 47year old female who presents to the office with concerns of a perianal lesion which biopsy showed AIN 2.  We discussed the risk and benefits of further investigation and she decided to proceed with high-resolution anoscopy and excision of the perianal lesion.  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  Left lateral and right lateral acetowhite and Lugol's staining within the anal canal.  DESCRIPTION: The patient was identified in the preoperative holding area and taken to the OR where they were laid prone on the operating room table in jack knife position. MAC anesthesia was smoothly induced.  The patient was then prepped and draped in the usual sterile fashion. A surgical timeout was performed indicating the correct patient, procedure, positioning and preoperative antibioitics. SCDs were noted to be in place and functioning prior to the operation.   After this was completed, a sponge was soaked in 5% acetic acid was placed over the perianal region. This was allowed to soak for 2 minutes. The sponge was removed and the perianal region was evaluated with a colposcope.  There was a 1 x 1 cm region of perianal  acetowhite staining.  There were several small perianal condyloma that also stained acetowhite.  They were Lugol's negative.  The internal anal canal was evaluated via anoscopy with a Hill-Ferguson anoscope.  There were acetowhite and Lugol positive lesions noted in the right lateral and left lateral anal canal.  These were both biopsied using Metzenbaum scissors.  Biopsy sites were then closed with a 2-0 chromic suture.  The area was then ablated with electrocautery.    I then excised the left lateral perianal lesion using a 15 blade scalpel.  This was sent to pathology for further examination.  Surgical site was closed using a running 3-0 chromic suture.  Hemostasis was good.  Patient tolerated procedure well was awakened from anesthesia and sent to the postanesthesia care unit in stable condition.  All counts were correct.  I have reviewed the NFairforestfor this patient.

## 2017-12-09 NOTE — Anesthesia Preprocedure Evaluation (Addendum)
Anesthesia Evaluation  Patient identified by MRN, date of birth, ID band Patient awake    Reviewed: Allergy & Precautions, NPO status , Patient's Chart, lab work & pertinent test results  Airway Mallampati: II  TM Distance: >3 FB Neck ROM: Full    Dental  (+) Teeth Intact, Dental Advisory Given   Pulmonary Current Smoker,    breath sounds clear to auscultation       Cardiovascular negative cardio ROS   Rhythm:Regular Rate:Normal     Neuro/Psych Anxiety    GI/Hepatic negative GI ROS, Neg liver ROS,   Endo/Other  negative endocrine ROS  Renal/GU negative Renal ROS     Musculoskeletal negative musculoskeletal ROS (+)   Abdominal Normal abdominal exam  (+)   Peds  Hematology negative hematology ROS (+)   Anesthesia Other Findings   Reproductive/Obstetrics negative OB ROS                          Anesthesia Physical Anesthesia Plan  ASA: II  Anesthesia Plan: MAC   Post-op Pain Management:    Induction: Intravenous  PONV Risk Score and Plan: 3 and Ondansetron, Midazolam and Propofol infusion  Airway Management Planned: Nasal Cannula  Additional Equipment: None  Intra-op Plan:   Post-operative Plan:   Informed Consent: I have reviewed the patients History and Physical, chart, labs and discussed the procedure including the risks, benefits and alternatives for the proposed anesthesia with the patient or authorized representative who has indicated his/her understanding and acceptance.   Dental advisory given  Plan Discussed with: CRNA  Anesthesia Plan Comments:        Anesthesia Quick Evaluation

## 2017-12-09 NOTE — Anesthesia Postprocedure Evaluation (Signed)
Anesthesia Post Note  Patient: Pincus Large  Procedure(s) Performed: HIGH RESOLUTION ANOSCOPY WITH  BIOPSY (N/A Rectum)     Patient location during evaluation: PACU Anesthesia Type: MAC Level of consciousness: awake and alert Pain management: pain level controlled Vital Signs Assessment: post-procedure vital signs reviewed and stable Respiratory status: spontaneous breathing, nonlabored ventilation, respiratory function stable and patient connected to nasal cannula oxygen Cardiovascular status: stable and blood pressure returned to baseline Postop Assessment: no apparent nausea or vomiting Anesthetic complications: no    Last Vitals:  Vitals:   12/09/17 0845 12/09/17 0854  BP: 130/85   Pulse: 61 71  Resp: (!) 21 20  Temp:    SpO2: 97% 99%    Last Pain:  Vitals:   12/09/17 0845  TempSrc:   PainSc: 0-No pain                 Effie Berkshire

## 2017-12-09 NOTE — Anesthesia Procedure Notes (Signed)
Procedure Name: MAC Date/Time: 12/09/2017 7:37 AM Performed by: Wanita Chamberlain, CRNA Pre-anesthesia Checklist: Patient identified, Emergency Drugs available, Patient being monitored, Suction available and Timeout performed Patient Re-evaluated:Patient Re-evaluated prior to induction Oxygen Delivery Method: Nasal cannula Induction Type: IV induction

## 2017-12-13 ENCOUNTER — Encounter (HOSPITAL_BASED_OUTPATIENT_CLINIC_OR_DEPARTMENT_OTHER): Payer: Self-pay | Admitting: General Surgery

## 2018-01-06 ENCOUNTER — Other Ambulatory Visit: Payer: Self-pay | Admitting: Obstetrics & Gynecology

## 2018-01-06 NOTE — Telephone Encounter (Signed)
Called into pharmacy

## 2018-07-25 ENCOUNTER — Encounter: Payer: 59 | Admitting: Obstetrics & Gynecology

## 2019-01-02 ENCOUNTER — Other Ambulatory Visit: Payer: Self-pay

## 2019-01-03 ENCOUNTER — Encounter: Payer: Self-pay | Admitting: Obstetrics & Gynecology

## 2019-01-03 ENCOUNTER — Ambulatory Visit (INDEPENDENT_AMBULATORY_CARE_PROVIDER_SITE_OTHER): Payer: 59 | Admitting: Obstetrics & Gynecology

## 2019-01-03 VITALS — BP 140/90 | Ht 62.5 in | Wt 197.0 lb

## 2019-01-03 DIAGNOSIS — Z85048 Personal history of other malignant neoplasm of rectum, rectosigmoid junction, and anus: Secondary | ICD-10-CM | POA: Diagnosis not present

## 2019-01-03 DIAGNOSIS — Z23 Encounter for immunization: Secondary | ICD-10-CM

## 2019-01-03 DIAGNOSIS — N951 Menopausal and female climacteric states: Secondary | ICD-10-CM

## 2019-01-03 DIAGNOSIS — Z6835 Body mass index (BMI) 35.0-35.9, adult: Secondary | ICD-10-CM

## 2019-01-03 DIAGNOSIS — D013 Carcinoma in situ of anus and anal canal: Secondary | ICD-10-CM

## 2019-01-03 DIAGNOSIS — Z01419 Encounter for gynecological examination (general) (routine) without abnormal findings: Secondary | ICD-10-CM

## 2019-01-03 DIAGNOSIS — E6609 Other obesity due to excess calories: Secondary | ICD-10-CM

## 2019-01-03 DIAGNOSIS — Z86002 Personal history of in-situ neoplasm of other and unspecified genital organs: Secondary | ICD-10-CM

## 2019-01-03 DIAGNOSIS — F419 Anxiety disorder, unspecified: Secondary | ICD-10-CM

## 2019-01-03 DIAGNOSIS — Z9079 Acquired absence of other genital organ(s): Secondary | ICD-10-CM

## 2019-01-03 DIAGNOSIS — Z1151 Encounter for screening for human papillomavirus (HPV): Secondary | ICD-10-CM

## 2019-01-03 DIAGNOSIS — D071 Carcinoma in situ of vulva: Secondary | ICD-10-CM

## 2019-01-03 MED ORDER — ALPRAZOLAM 0.25 MG PO TABS: ORAL_TABLET | ORAL | 0 refills | Status: DC

## 2019-01-03 NOTE — Addendum Note (Signed)
Addended by: Thurnell Garbe A on: 01/03/2019 08:47 AM   Modules accepted: Orders

## 2019-01-03 NOTE — Progress Notes (Signed)
Maureen Wall [birth year] [id number]   History:    48 y.o. G3P0A3 Married.  Bilateral Salpingectomy.  Adopted son is 45 yo.    RP:  Established patient presenting for annual gyn exam   HPI: Menstrual periods every 2 to 3 months with variable flow lasting about 5 days.  No breakthrough bleeding.  No pelvic pain.  No pain with intercourse.  Urine and bowel movements normal.  Breasts normal.  Body mass index 35.46.  Not exercising regularly.  Health labs with family physician.  Followed by Dr. Sherol Dade.  Had anal biopsies in August 2019 showing AIN 2 and 3.  Per patient, a repeat anoscopy recently did not reveal any lesion.  She will be reevaluated annually by Dr. Marcello Moores for another 2 years.   Past medical history,surgical history, family history and social history were all reviewed and documented in the EPIC chart.  Gynecologic History Patient's last menstrual period was 12/22/2018. Contraception: Bilateral Salpingectomy Last Pap: 07/2017. Results were: Negative/HPV HR Negative Last mammogram: 11/2017. Results were: Negative Bone Density: Never Colonoscopy: Never  Obstetric History OB History  Gravida Para Term Preterm AB Living  3 0     3 0  SAB TAB Ectopic Multiple Live Births  1   2        # Outcome Date GA Lbr Len/2nd Weight Sex Delivery Anes PTL Lv  3 Ectopic           2 Ectopic           1 SAB              ROS: A ROS was performed and pertinent positives and negatives are included in the history.  GENERAL: No fevers or chills. HEENT: No change in vision, no earache, sore throat or sinus congestion. NECK: No pain or stiffness. CARDIOVASCULAR: No chest pain or pressure. No palpitations. PULMONARY: No shortness of breath, cough or wheeze. GASTROINTESTINAL: No abdominal pain, nausea, vomiting or diarrhea, melena or bright red blood per rectum. GENITOURINARY: No urinary frequency, urgency, hesitancy or dysuria. MUSCULOSKELETAL: No joint or muscle pain, no back pain, no  recent trauma. DERMATOLOGIC: No rash, no itching, no lesions. ENDOCRINE: No polyuria, polydipsia, no heat or cold intolerance. No recent change in weight. HEMATOLOGICAL: No anemia or easy bruising or bleeding. NEUROLOGIC: No headache, seizures, numbness, tingling or weakness. PSYCHIATRIC: No depression, no loss of interest in normal activity or change in sleep pattern.     Exam:   BP 140/90   Ht 5' 2.5" (1.588 m)   Wt 197 lb (89.4 kg)   LMP 12/22/2018   BMI 35.46 kg/m   Body mass index is 35.46 kg/m.  General appearance : Well developed well nourished female. No acute distress HEENT: Eyes: no retinal hemorrhage or exudates,  Neck supple, trachea midline, no carotid bruits, no thyroidmegaly Lungs: Clear to auscultation, no rhonchi or wheezes, or rib retractions  Heart: Regular rate and rhythm, no murmurs or gallops Breast:Examined in sitting and supine position were symmetrical in appearance, no palpable masses or tenderness,  no skin retraction, no nipple inversion, no nipple discharge, no skin discoloration, no axillary or supraclavicular lymphadenopathy Abdomen: no palpable masses or tenderness, no rebound or guarding Extremities: no edema or skin discoloration or tenderness  Pelvic: Vulva: Normal             Vagina: No gross lesions or discharge  Cervix: No gross lesions or discharge.  Pap/HPV HR done.  Uterus  AV, normal size,  shape and consistency, non-tender and mobile  Adnexa  Without masses or tenderness  Anus: Normal   Assessment/Plan:  48 y.o. female for annual exam   1. Encounter for routine gynecological examination with Papanicolaou smear of cervix Normal gynecologic exam.  Pap test with high-risk HPV done.  Breast exam normal.  Patient will schedule a screening mammogram.  We will do colonoscopy at age 1.  Health labs with family physician.    2. Status post bilateral salpingectomy  3. Perimenopause Oligomenorrhea every 2 to 3 months with normal flow and no  breakthrough bleeding.  No hot flashes or night sweats.  Precautions for abnormal bleeding discussed with patient.  Will observe at this time.  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    6. Anxiety Mild anxiety with presentations.  Xanax 0.25 as needed represcribed.  7. Class 2 obesity due to excess calories without serious comorbidity with body mass index (BMI) of 35.0 to 35.9 in adult Recommend a lower calorie/carb diet such as [employer].  Aerobic physical activities 5 times a week and weightlifting every 2 days.  Other orders - ALPRAZolam (XANAX) 0.25 MG tablet; TAKE 1 TABLET BY MOUTH ONCE A DAY AS NEEDED  [name] MD, 8:09 AM 01/03/2019

## 2019-01-03 NOTE — Patient Instructions (Signed)
1. Encounter for routine gynecological examination with Papanicolaou smear of cervix Normal gynecologic exam.  Pap test with high-risk HPV done.  Breast exam normal.  Patient will schedule a screening mammogram.  We will do colonoscopy at age 48.  Health labs with family physician.    2. Status post bilateral salpingectomy  3. Perimenopause Oligomenorrhea every 2 to 3 months with normal flow and no breakthrough bleeding.  No hot flashes or night sweats.  Precautions for abnormal bleeding discussed with patient.  Will observe at this time.  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    6. Anxiety Mild anxiety with presentations.  Xanax 0.25 as needed represcribed.  7. Class 2 obesity due to excess calories without serious comorbidity with body mass index (BMI) of 35.0 to 35.9 in adult Recommend a lower calorie/carb diet such as [employer].  Aerobic physical activities 5 times a week and weightlifting every 2 days.  Other orders - ALPRAZolam (XANAX) 0.25 MG tablet; TAKE 1 TABLET BY MOUTH ONCE A DAY AS NEEDED  Waylynn, it was a pleasure seeing you today!  I will inform you of your results as soon as they are available.

## 2019-01-08 LAB — PAP, TP IMAGING W/ HPV RNA, RFLX HPV TYPE 16,18/45: HPV DNA High Risk: NOT DETECTED

## 2019-06-11 ENCOUNTER — Encounter: Payer: Self-pay | Admitting: Obstetrics & Gynecology

## 2020-02-06 ENCOUNTER — Ambulatory Visit (INDEPENDENT_AMBULATORY_CARE_PROVIDER_SITE_OTHER): Payer: 59 | Admitting: Obstetrics & Gynecology

## 2020-02-06 ENCOUNTER — Other Ambulatory Visit: Payer: Self-pay

## 2020-02-06 ENCOUNTER — Encounter: Payer: Self-pay | Admitting: *Deleted

## 2020-02-06 ENCOUNTER — Encounter: Payer: Self-pay | Admitting: Obstetrics & Gynecology

## 2020-02-06 VITALS — BP 126/84 | Ht 62.5 in | Wt 177.0 lb

## 2020-02-06 DIAGNOSIS — E6609 Other obesity due to excess calories: Secondary | ICD-10-CM

## 2020-02-06 DIAGNOSIS — Z6831 Body mass index (BMI) 31.0-31.9, adult: Secondary | ICD-10-CM

## 2020-02-06 DIAGNOSIS — D013 Carcinoma in situ of anus and anal canal: Secondary | ICD-10-CM

## 2020-02-06 DIAGNOSIS — Z01419 Encounter for gynecological examination (general) (routine) without abnormal findings: Secondary | ICD-10-CM

## 2020-02-06 DIAGNOSIS — D071 Carcinoma in situ of vulva: Secondary | ICD-10-CM | POA: Diagnosis not present

## 2020-02-06 DIAGNOSIS — N951 Menopausal and female climacteric states: Secondary | ICD-10-CM | POA: Diagnosis not present

## 2020-02-06 DIAGNOSIS — Z9079 Acquired absence of other genital organ(s): Secondary | ICD-10-CM

## 2020-02-06 DIAGNOSIS — Z23 Encounter for immunization: Secondary | ICD-10-CM | POA: Diagnosis not present

## 2020-02-06 NOTE — Progress Notes (Signed)
Maureen Wall 16-Feb-1971 [id number]   History:    49 y.o. G3P0A3 Married.  Bilateral Salpingectomy.  Adopted son is 60 yo.    RP:  Established patient presenting for annual gyn exam   HPI: Oligomenorrhea with LMP end of April 2021.  Normal flow x 5 days when having a period.  No hot flushes or night sweats.  No breakthrough bleeding.  No pelvic pain.  No pain with intercourse. Urine and bowel movements normal.  Breasts normal.  Body mass index improved to 31.86 with Weight [employer].  Exercising regularly.  Health labs with family physician.  Followed by Dr. 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  Anoscopy Negative since.  Needs Colonoscopy.  Past medical history,surgical history, family history and social history were all reviewed and documented in the EPIC chart.  Gynecologic History Patient's last menstrual period was 08/07/2019.  Obstetric History OB History  Gravida Para Term Preterm AB Living  3 0     3 0  SAB TAB Ectopic Multiple Live Births  1   2        # Outcome Date GA Lbr Len/2nd Weight Sex Delivery Anes PTL Lv  3 Ectopic           2 Ectopic           1 SAB              ROS: A ROS was performed and pertinent positives and negatives are included in the history.  GENERAL: No fevers or chills. HEENT: No change in vision, no earache, sore throat or sinus congestion. NECK: No pain or stiffness. CARDIOVASCULAR: No chest pain or pressure. No palpitations. PULMONARY: No shortness of breath, cough or wheeze. GASTROINTESTINAL: No abdominal pain, nausea, vomiting or diarrhea, melena or bright red blood per rectum. GENITOURINARY: No urinary frequency, urgency, hesitancy or dysuria. MUSCULOSKELETAL: No joint or muscle pain, no back pain, no recent trauma. DERMATOLOGIC: No rash, no itching, no lesions. ENDOCRINE: No polyuria, polydipsia, no heat or cold intolerance. No recent change in weight. HEMATOLOGICAL: No anemia or easy bruising or bleeding.  NEUROLOGIC: No headache, seizures, numbness, tingling or weakness. PSYCHIATRIC: No depression, no loss of interest in normal activity or change in sleep pattern.     Exam:   BP 126/84   Ht 5' 2.5" (1.588 m)   Wt 177 lb (80.3 kg)   LMP 08/07/2019   BMI 31.86 kg/m   Body mass index is 31.86 kg/m.  General appearance : Well developed well nourished female. No acute distress HEENT: Eyes: no retinal hemorrhage or exudates,  Neck supple, trachea midline, no carotid bruits, no thyroidmegaly Lungs: Clear to auscultation, no rhonchi or wheezes, or rib retractions  Heart: Regular rate and rhythm, no murmurs or gallops Breast:Examined in sitting and supine position were symmetrical in appearance, no palpable masses or tenderness,  no skin retraction, no nipple inversion, no nipple discharge, no skin discoloration, no axillary or supraclavicular lymphadenopathy Abdomen: no palpable masses or tenderness, no rebound or guarding Extremities: no edema or skin discoloration or tenderness  Pelvic: Vulva: Normal             Vagina: No gross lesions or discharge  Cervix: No gross lesions or discharge.  Pap reflex done.  Uterus  AV, normal size, shape and consistency, non-tender and mobile  Adnexa  Without masses or tenderness  Anus: Normal   Assessment/Plan:  49 y.o. female for annual exam   1. Encounter  for routine gynecological examination with Papanicolaou smear of cervix Normal gynecologic exam. Pap reflex done. Breast exam normal. Screening mammogram February 2021 was negative. Will refer to gastro to schedule first screening colonoscopy for patient. Health labs with family physician.  2. Status post bilateral salpingectomy  3. Perimenopause Oligomenorrhea with LMP 6 months ago, normal.  No vasomotor symptom of menopause.  Will observe at this time.  Bleeding precautions reviewed.  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  5. AIN III (anal  intraepithelial neoplasia III) Followed by Dr Marcello Moores, last Anoscopy Negative.  6. Class 1 obesity due to excess calories without serious comorbidity with body mass index (BMI) of 31.0 to 31.9 in adult Loosing weight on Weight Wat[employer].  Continue with fitness.  7. Flu vaccine need Flu shot given.  Other orders - Flu Vaccine QUAD 36+ mos IM (Fluarix, Quad PF)  [name] MD, 8:18 AM 02/06/2020

## 2020-02-06 NOTE — Addendum Note (Signed)
Addended by: Thurnell Garbe A on: 02/06/2020 08:53 AM   Modules accepted: Orders

## 2020-02-08 LAB — PAP IG W/ RFLX HPV ASCU

## 2020-09-29 ENCOUNTER — Ambulatory Visit: Payer: 59 | Admitting: Nurse Practitioner

## 2020-09-29 ENCOUNTER — Other Ambulatory Visit: Payer: Self-pay

## 2020-09-29 ENCOUNTER — Encounter: Payer: Self-pay | Admitting: Nurse Practitioner

## 2020-09-29 VITALS — BP 134/80 | HR 74 | Resp 16 | Wt 194.0 lb

## 2020-09-29 DIAGNOSIS — N750 Cyst of Bartholin's gland: Secondary | ICD-10-CM

## 2020-09-29 LAB — WET PREP FOR TRICH, YEAST, CLUE

## 2020-09-29 MED ORDER — DOXYCYCLINE HYCLATE 100 MG PO CAPS: ORAL_CAPSULE | ORAL | 0 refills | Status: DC

## 2020-09-29 NOTE — Patient Instructions (Signed)
Bartholin's Cyst  A Bartholin's cyst is a fluid-filled sac that forms on a Bartholin's gland. Bartholin's glands are small glands in the folds of skin near the opening of the vagina (labia). This type of cyst causes a bulge or lump near the opening of the vagina. If you have a cyst that is small and not infected, you may be able to take care of it at home. If your cyst gets infected, it may cause pain and your doctormay need to drain it. What are the causes? This condition may be caused by a blocked Bartholin's gland. Germs (bacteria) inside of the cyst can cause an infection. What are the signs or symptoms? A bulge or lump near the opening of the vagina. Discomfort or pain. Redness, swelling, or fluid draining from the area. How is this treated? You may not need treatment if your cyst is not causing symptoms. The cyst cango away on its own with home care. Home care includes hot baths or heat therapy. Large cysts or cysts that are infected may be treated with: Antibiotic medicine. A procedure to drain the fluid. Cysts that keep coming back will need to be drained many times. Your doctor Nyra Jabs to you about surgery to remove the cyst. Follow these instructions at home: Medicines Take over-the-counter and prescription medicines only as told by your doctor. If you were prescribed an antibiotic medicine, take it as told by your doctor. Do not stop taking it even if you start to feel better. Managing pain and swelling Try sitz baths to help with pain and swelling. A sitz bath is a warm water bath in which the water only comes up to your hips and should cover your buttocks. You may take sitz baths a few times a day. If told, put heat on the affected area as often as needed. Use the heat source that your doctor recommends, such as a moist heat pack or a heating pad. Place a towel between your skin and the heat source. Leave the heat on for 20-30 minutes. Take off the heat if your skin turns bright  red. This is very important. If you cannot feel pain, heat, or cold, you have a greater risk of getting burned. General instructions If your cyst was drained: Follow instructions from your doctor about how to take care of your wound. Use feminine pads to absorb any fluid. Do not push on or squeeze your cyst. Do not have sex until the cyst has gone away or your wound from drainage has healed. Take these steps to help prevent a cyst from returning, and to prevent other cysts from forming: Take a bath or shower once a day. Clean the area around your vagina with mild soap and water when you bathe. Practice safe sex to prevent STIs. Talk with your doctor about how to prevent STIs and which forms of birth control to use. Keep all follow-up visits. Contact a doctor if: You have a fever. You get more redness, swelling, or pain around your cyst. You have fluid, blood, pus, or a bad smell coming from your cyst. You have a cyst that gets larger or a cyst that comes back. Summary A Bartholin's cyst is a fluid-filled sac that forms on a Bartholin's gland. These small glands are found in the folds of skin near the opening of the vagina (labia). This type of cyst causes a bulge or lump near the opening of the vagina. Try sitz baths a few times a day to help with pain  and swelling. Do not push on or squeeze your cyst. This information is not intended to replace advice given to you by your health care provider. Make sure you discuss any questions you have with your healthcare provider. Document Revised: 08/27/2019 Document Reviewed: 08/27/2019 Elsevier Patient Education  [employer].

## 2020-09-29 NOTE — Progress Notes (Signed)
GYNECOLOGY  VISIT  CC:   bump in vulva  HPI: 50 y.o. G8P0030 Married White or Caucasian female here for knot on right labia.  Noticed a knot in vulva on June 10. It is not painful, but noticed in shower. Denies problems with vaginal discharge or pain with intercourse, denies risk of STD  HxVAIN 3 years ago, skin was removed. This seems much different.   Period Pattern: (!) Irregular (irregular cycle lasted for 3 days)  GYNECOLOGIC HISTORY: No LMP recorded. (Menstrual status: Irregular Periods). Contraception: tubes removed Menopausal hormone therapy: none  Patient Active Problem List   Diagnosis Date Noted   AIN III (anal intraepithelial neoplasia III) 05/19/2016   Condyloma acuminatum of vulva 05/13/2015   Anxiety 05/08/2015   VIN III (vulvar intraepithelial neoplasia III) 03/20/2015   Ovarian cyst, right 11/02/2013   VAIN II (vaginal intraepithelial neoplasia grade II) 03/07/2012   Hx of ectopic pregnancy 02/10/2012   Cervical dysplasia, mild 02/10/2012   Weight gain 02/10/2012   Smoker 02/10/2012   Vaginal intraepithelial neoplasia III (VAIN III) 01/22/2011   Hypercholesteremia 01/22/2011    Past Medical History:  Diagnosis Date   Anal intraepithelial neoplasia III (AIN III)    Anxiety    DUB (dysfunctional uterine bleeding)    History of cervical dysplasia    CIN 1   History of ectopic pregnancy    2001  S/P  RIGHT SALPINGECTOMY/  2004  S/P  LSO   History of ectopic pregnancy    History of vaginal dysplasia    VAIN 2/3  RECURRENT   History of vulvar dysplasia    2017 recurrent VIN 3   s/p  wle   Hyperlipidemia    Wears contact lenses     Past Surgical History:  Procedure Laterality Date   CO2 LASER APPLICATION Left 07/19/1446   Procedure: CO2 LASER ABLATION OF ANTERIOR ANAL MUCOSA/ANAL VERGE LEFT LOWER LABIA MAJORA;  Surgeon: Everitt Amber, MD;  Location: [facility];  Service: Gynecology;  Laterality: Left;   EXPLORATORY LAPAROTOMY W/  LEFT  SALPINGOOPHORECTOMY/  LYSIS ADHESIONS (ECTOPIC)  12-06-2002   EXPLORATORY LAPAROTOMY W/  RIGHT SALPINGECTOMY FOR ECTOPIC  05-07-1999   HIGH RESOLUTION ANOSCOPY N/A 12/09/2017   Procedure: HIGH RESOLUTION ANOSCOPY WITH  BIOPSY;  Surgeon: Leighton Ruff, MD;  Location: [facility];  Service: General;  Laterality: N/A;   LASER ABLATION VAGINAL DYSPLASIA  11-10-1999   MOUTH SURGERY     VULVECTOMY Left 06/05/2015   Procedure: WIDE LOCAL EXCISION OF VULVA;  Surgeon: Everitt Amber, MD;  Location: [facility];  Service: Gynecology;  Laterality: Left;   WIDE LOCAL EXCISION LEFT LABIA MINORA/  LASER ABLATION OF CERVIX AND VAGINA  03-10-2000    MEDS:   Current Outpatient Medications on File Prior to Visit  Medication Sig Dispense Refill   atorvastatin (LIPITOR) 40 MG tablet Take 1 tablet by mouth daily.     calcium carbonate (OS-CAL) 600 MG TABS Take 600 mg by mouth 2 (two) times daily with a meal.       cholecalciferol (VITAMIN D) 1000 UNITS tablet Take 1,000 Units by mouth daily.     No current facility-administered medications on file prior to visit.    ALLERGIES: Morphine and related and Septra [bactrim]  Family History  Problem Relation Age of Onset   Diabetes Sister    Diabetes Brother    Hypertension Brother    Diabetes Maternal Grandmother      Review of Systems  Constitutional:  Negative.   HENT: Negative.    Eyes: Negative.   Respiratory: Negative.    Cardiovascular: Negative.   Gastrointestinal: Negative.   Endocrine: Negative.   Genitourinary: Negative.   Musculoskeletal: Negative.   Skin:        Knot on right labia  Allergic/Immunologic: Negative.   Neurological: Negative.   Hematological: Negative.   Psychiatric/Behavioral: Negative.     PHYSICAL EXAMINATION:    BP 134/80   Pulse 74   Resp 16   Wt 194 lb (88 kg)   BMI 34.92 kg/m     General appearance: alert, cooperative, no acute distress  Lymph:  no inguinal LAD noted  Pelvic:  External genitalia:  no lesions,               Urethra:  normal appearing urethra with no masses, tenderness or lesions              Bartholins and Skenes: Right Bartholins duct cyst ~2.5cm in diameter, non tender, soft              Vagina: normal appearing vagina               Cervix: no cervical motion tenderness and no lesions              Bimanual Exam:  Uterus:  normal size, contour, position, consistency, mobility, non-tender              Adnexa: no mass, fullness, tenderness              Wet mount neg  Chaperone, Joy, CMA, was present for exam.  Assessment: Cyst of Bartholin's gland duct - Plan: WET PREP FOR TRICH, YEAST, CLUE  Plan: Encouraged patient to soak in tub or sitz bath 15-20 min twice daily Advised not to squeeze, but may gently massage  Rx sent for: doxycycline (VIBRAMYCIN) 100 MG capsule BID x 7 days, but advised does not need to take unless area becomes painful  F/U 2 weeks for re-evaluation and management plan

## 2020-10-14 ENCOUNTER — Other Ambulatory Visit: Payer: Self-pay

## 2020-10-14 ENCOUNTER — Ambulatory Visit: Payer: 59 | Admitting: Obstetrics & Gynecology

## 2020-10-14 ENCOUNTER — Encounter: Payer: Self-pay | Admitting: Obstetrics & Gynecology

## 2020-10-14 VITALS — BP 118/70 | HR 78 | Resp 16

## 2020-10-14 DIAGNOSIS — N750 Cyst of Bartholin's gland: Secondary | ICD-10-CM

## 2020-10-14 DIAGNOSIS — N951 Menopausal and female climacteric states: Secondary | ICD-10-CM | POA: Diagnosis not present

## 2020-10-14 NOTE — Progress Notes (Signed)
    Maureen Wall 03/29/1971 [id number]        50 y.o.  G3P0030   RP: F/U Rt Bartholin gland cyst  HPI: Much improved Rt Bartholin gland cyst.  No pain or tenderness anymore.  Feels smaller.  Seen by Maureen Ganja NP on 09/29/2020 when started to do warm soaking and massages to the Rt Bartholin gland cyst.  C/O Hot flushes and night sweats all the time.  LMP 06/2020, menstrual period before was 09/2019.  Cigarette smoker, 1/2 pack daily.   OB History  Gravida Para Term Preterm AB Living  3 0     3 0  SAB IAB Ectopic Multiple Live Births  1   2        # Outcome Date GA Lbr Len/2nd Weight Sex Delivery Anes PTL Lv  3 Ectopic           2 Ectopic           1 SAB             Past medical history,surgical history, problem list, medications, allergies, family history and social history were all reviewed and documented in the EPIC chart.   Directed ROS with pertinent positives and negatives documented in the history of present illness/assessment and plan.  Exam:  Vitals:   10/14/20 1146  BP: 118/70  Pulse: 78  Resp: 16   General appearance:  Normal  Abdomen: Normal  Gynecologic exam: Vulva:  Very small, about 1.5 cm, non-tender Rt Bartholin cyst.  No drainage, no erythema.   Assessment/Plan:  49 y.o. G3P0030   1. Cyst of Bartholin's gland duct No sign of infection.  Small Rt Bartholin gland cyst now asymptomatic.  Counseling done.  Recommend continuing warm soaking.  Will observe.  Precautions discuss if becomes larger and painful with signs of infection.  Marsupialization procedure explained as next step if a Bartholin Gland Abscess develops.  2. Menopausal syndrome  HRT discussed.  Cigarette smoker.  Will try Phyto-Estrogens.  If no improvement, will consider HRT.  Maureen Bruins MD, 12:10 PM 10/14/2020

## 2020-10-15 ENCOUNTER — Encounter: Payer: Self-pay | Admitting: Obstetrics & Gynecology

## 2021-02-06 ENCOUNTER — Other Ambulatory Visit: Payer: Self-pay

## 2021-02-06 ENCOUNTER — Ambulatory Visit (INDEPENDENT_AMBULATORY_CARE_PROVIDER_SITE_OTHER): Payer: 59 | Admitting: Obstetrics & Gynecology

## 2021-02-06 ENCOUNTER — Other Ambulatory Visit (HOSPITAL_COMMUNITY)
Admission: RE | Admit: 2021-02-06 | Discharge: 2021-02-06 | Disposition: A | Discharge status: Home / Self Care | Payer: 59 | Source: Ambulatory Visit | Attending: Obstetrics & Gynecology | Admitting: Obstetrics & Gynecology

## 2021-02-06 ENCOUNTER — Encounter: Payer: Self-pay | Admitting: Obstetrics & Gynecology

## 2021-02-06 VITALS — BP 120/76 | HR 83 | Resp 16 | Ht 62.75 in | Wt 203.0 lb

## 2021-02-06 DIAGNOSIS — Z6836 Body mass index (BMI) 36.0-36.9, adult: Secondary | ICD-10-CM

## 2021-02-06 DIAGNOSIS — Z01419 Encounter for gynecological examination (general) (routine) without abnormal findings: Secondary | ICD-10-CM | POA: Insufficient documentation

## 2021-02-06 DIAGNOSIS — N951 Menopausal and female climacteric states: Secondary | ICD-10-CM | POA: Diagnosis not present

## 2021-02-06 DIAGNOSIS — D071 Carcinoma in situ of vulva: Secondary | ICD-10-CM

## 2021-02-06 DIAGNOSIS — D013 Carcinoma in situ of anus and anal canal: Secondary | ICD-10-CM

## 2021-02-06 DIAGNOSIS — Z23 Encounter for immunization: Secondary | ICD-10-CM

## 2021-02-06 DIAGNOSIS — Z9079 Acquired absence of other genital organ(s): Secondary | ICD-10-CM | POA: Diagnosis not present

## 2021-02-06 NOTE — Progress Notes (Addendum)
Maureen Wall 01-Jun-1970 [id number]   History:    50 y.o.  G3P0A3 Married.  Bilateral Salpingectomy.  Adopted son is 60 yo going into Plumbing.     RP:  Established patient presenting for annual gyn exam    HPI: Oligomenorrhea with LMP light 06/2020.  Minimal hot flushes.  Declines HRT. No breakthrough bleeding.  No pelvic pain.  No pain with intercourse. Urine and bowel movements normal.  Breasts normal.  Will obtain Mammo from [facility] 2022.  Body mass index increased again to 36.25.  Health labs with family physician.  Followed by Dr. 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  Anoscopy Negative since.  Needs Colonoscopy.   Past medical history,surgical history, family history and social history were all reviewed and documented in the EPIC chart.  Gynecologic History No LMP recorded. (Menstrual status: Irregular Periods).  Obstetric History OB History  Gravida Para Term Preterm AB Living  3 0     3 0  SAB IAB Ectopic Multiple Live Births  1   2        # Outcome Date GA Lbr Len/2nd Weight Sex Delivery Anes PTL Lv  3 Ectopic           2 Ectopic           1 SAB              ROS: A ROS was performed and pertinent positives and negatives are included in the history.  GENERAL: No fevers or chills. HEENT: No change in vision, no earache, sore throat or sinus congestion. NECK: No pain or stiffness. CARDIOVASCULAR: No chest pain or pressure. No palpitations. PULMONARY: No shortness of breath, cough or wheeze. GASTROINTESTINAL: No abdominal pain, nausea, vomiting or diarrhea, melena or bright red blood per rectum. GENITOURINARY: No urinary frequency, urgency, hesitancy or dysuria. MUSCULOSKELETAL: No joint or muscle pain, no back pain, no recent trauma. DERMATOLOGIC: No rash, no itching, no lesions. ENDOCRINE: No polyuria, polydipsia, no heat or cold intolerance. No recent change in weight. HEMATOLOGICAL: No anemia or easy bruising or bleeding. NEUROLOGIC: No  headache, seizures, numbness, tingling or weakness. PSYCHIATRIC: No depression, no loss of interest in normal activity or change in sleep pattern.     Exam:   BP 120/76   Pulse 83   Resp 16   Ht 5' 2.75" (1.594 m)   Wt 203 lb (92.1 kg)   BMI 36.25 kg/m   Body mass index is 36.25 kg/m.  General appearance : Well developed well nourished female. No acute distress HEENT: Eyes: no retinal hemorrhage or exudates,  Neck supple, trachea midline, no carotid bruits, no thyroidmegaly Lungs: Clear to auscultation, no rhonchi or wheezes, or rib retractions  Heart: Regular rate and rhythm, no murmurs or gallops Breast:Examined in sitting and supine position were symmetrical in appearance, no palpable masses or tenderness,  no skin retraction, no nipple inversion, no nipple discharge, no skin discoloration, no axillary or supraclavicular lymphadenopathy Abdomen: no palpable masses or tenderness, no rebound or guarding Extremities: no edema or skin discoloration or tenderness  Pelvic: Vulva: Normal             Vagina: No gross lesions or discharge  Cervix: No gross lesions or discharge.  Pap reflex done.  Uterus  AV, normal size, shape and consistency, non-tender and mobile  Adnexa  Without masses or tenderness  Rectal exam Neg   Assessment/Plan:  50 y.o. female for annual exam  1. Encounter for routine gynecological examination with Papanicolaou smear of cervix Oligomenorrhea with LMP light 06/2020.  Minimal hot flushes.  Declines HRT. No breakthrough bleeding.  No pelvic pain.  No pain with intercourse. Pap reflex done. Urine and bowel movements normal.  Breasts normal.  Will obtain Mammo from [facility] 2022.  Body mass index increased again to 36.25.  Health labs with family physician.  Followed by Dr. 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  Anoscopy Negative since.  Needs Colonoscopy. - Cytology - PAP( [facility])  2. Status post bilateral salpingectomy  3.  Perimenopause Oligomenorrhea with LMP light 06/2020.  Minimal hot flushes.  Declines HRT. No breakthrough bleeding.  Will observe at this time.  Bleeding precautions discussed.  4. VIN III (vulvar intraepithelial neoplasia III) Vulva normal on Gyn exam.  5. AIN III (anal intraepithelial neoplasia III) Followed by Dr. 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  Anoscopy Negative since.  Needs Colonoscopy.  6. Class 2 severe obesity due to excess calories with serious comorbidity and body mass index (BMI) of 36.0 to 36.9 in adult [facility]) Lower Calorie/Carb diet.  Increase fitness activities.  7. Need for immunization against influenza - Flu Vaccine QUAD 69mo+IM (Fluarix, Fluzone & Alfiuria Quad PF)   [name] MD, 8:16 AM 02/06/2021

## 2021-02-09 LAB — CYTOLOGY - PAP: Diagnosis: NEGATIVE

## 2021-10-09 ENCOUNTER — Encounter: Payer: Self-pay | Admitting: Physician Assistant

## 2021-11-05 ENCOUNTER — Encounter: Payer: Self-pay | Admitting: Physician Assistant

## 2021-11-05 ENCOUNTER — Ambulatory Visit: Payer: 59 | Admitting: Physician Assistant

## 2021-11-05 VITALS — BP 114/70 | HR 48 | Ht 62.5 in | Wt 213.4 lb

## 2021-11-05 DIAGNOSIS — Z1211 Encounter for screening for malignant neoplasm of colon: Secondary | ICD-10-CM | POA: Diagnosis not present

## 2021-11-05 MED ORDER — NA SULFATE-K SULFATE-MG SULF 17.5-3.13-1.6 GM/177ML PO SOLN: 1.0000 | Freq: Once | ORAL | 0 refills | Status: AC

## 2021-11-05 NOTE — Progress Notes (Signed)
Subjective:    Patient ID: Maureen Wall, female    DOB: 12-23-1970, 51 y.o.   MRN: [id number]  HPI Maureen Wall is a pleasant 51 year old white female, new to GI today referred by Dr. Rosaria Ferries for colonoscopy. Patient has not had prior colonoscopy.  Family history negative for colon cancer and polyps as far she is aware. She does not have any current concerning symptoms, specifically no complaints of abdominal pain or discomfort, having normal bowel movements, no issues with constipation or diarrhea, no melena or hematochezia.  She does have a history of anal AIN II-III diagnosed in 2019.  She was referred to Dr. Leighton Ruff and underwent anoscopy and excision with path showing AIN 3 HGSIL. Patient states that she did follow-up on an annual basis for the first 2 years with Dr. Marcello Moores, but has not seen her over the past couple of years.  I cannot see these notes in epic or Care Everywhere. Patient also has history of vulvar AIN III-and is followed by her gynecologist Dr. Dellis Filbert.  Other medical issues include obesity with BMI 38, hypertension and hyperlipidemia.  Review of Systems Pertinent positive and negative review of systems were noted in the above HPI section.  All other review of systems was otherwise negative.   Outpatient Encounter Medications as of 11/05/2021  Medication Sig   atenolol (TENORMIN) 25 MG tablet Take 25 mg by mouth daily.   atorvastatin (LIPITOR) 40 MG tablet Take 1 tablet by mouth daily.   calcium carbonate (OS-CAL) 600 MG TABS Take 600 mg by mouth 2 (two) times daily with a meal.     cholecalciferol (VITAMIN D) 1000 UNITS tablet Take 1,000 Units by mouth daily.   ezetimibe (ZETIA) 10 MG tablet Take 10 mg by mouth daily.   Na Sulfate-K Sulfate-Mg Sulf 17.5-3.13-1.6 GM/177ML SOLN Take 1 kit by mouth once for 1 dose.   No facility-administered encounter medications on file as of 11/05/2021.   Allergies  Allergen Reactions   Morphine And Related Itching     SEVERE   Septra [Bactrim] Nausea And Vomiting    GI  UPSET   Patient Active Problem List   Diagnosis Date Noted   AIN III (anal intraepithelial neoplasia III) 05/19/2016   Condyloma acuminatum of vulva 05/13/2015   Anxiety 05/08/2015   VIN III (vulvar intraepithelial neoplasia III) 03/20/2015   Ovarian cyst, right 11/02/2013   VAIN II (vaginal intraepithelial neoplasia grade II) 03/07/2012   Hx of ectopic pregnancy 02/10/2012   Cervical dysplasia, mild 02/10/2012   Weight gain 02/10/2012   Smoker 02/10/2012   Vaginal intraepithelial neoplasia III (VAIN III) 01/22/2011   Hypercholesteremia 01/22/2011   Social History   Socioeconomic History   Marital status: Married    Spouse name: Not on file   Number of children: 1   Years of education: Not on file   Highest education level: Not on file  Occupational History   Occupation: Freight forwarder  Tobacco Use   Smoking status: Every Day    Packs/day: 0.50    Years: 32.00    Total pack years: 16.00    Types: Cigarettes   Smokeless tobacco: Never  Vaping Use   Vaping Use: Never used  Substance and Sexual Activity   Alcohol use: Not Currently    Comment: OCCASIONALLY   Drug use: No   Sexual activity: Yes    Partners: Male    Birth control/protection: None  Other Topics Concern   Not on file  Social History Narrative  Not on file   Social Determinants of Health   Financial Resource Strain: Not on file  Food Insecurity: Not on file  Transportation Needs: Not on file  Physical Activity: Not on file  Stress: Not on file  Social Connections: Not on file  Intimate Partner Violence: Not on file    Maureen Wall's family history includes Colon polyps in her brother; Diabetes in her brother, maternal grandmother, and sister; Heart disease in her maternal grandfather, maternal grandmother, paternal grandfather, and paternal grandmother; Hypertension in her brother.      Objective:    Vitals:   11/05/21 1350  BP: 114/70  Pulse:  (!) 48  SpO2: 95%    Physical Exam Well-developed well-nourished Caucasian female in no acute distress.  Height, Weight, 213 BMI 38.4  HEENT; nontraumatic normocephalic, EOMI, PE R LA, sclera anicteric. Oropharynx; not examined today Neck; supple, no JVD Cardiovascular; regular rate and rhythm with S1-S2, no murmur rub or gallop Pulmonary; Clear bilaterally Abdomen; soft, nontender, nondistended, no palpable mass or hepatosplenomegaly, bowel sounds are active Rectal; external exam only, small skin tags, no other visible lesion, there is a very small 2 to 3 mm white papule on the perineum on the right, soft, mobile Skin; benign exam, no jaundice rash or appreciable lesions Extremities; no clubbing cyanosis or edema skin warm and dry Neuro/Psych; alert and oriented x4, grossly nonfocal mood and affect appropriate        Assessment & Plan:   #51 year old female referred for screening colonoscopy-no prior colonoscopy.  Negative family history colon cancer/polyps #2 personal history of anal AIN III status post excision Dr. Leighton Ruff [id number]  #3 history of vulvar AIN III-followed by gynecology #4 obesity #5.  Hyperlipidemia #6.  Hypertension  #7  2 to 3 mm perineal skin lesion on the right-question tiny condyloma  Plan; patient will be scheduled for colonoscopy with Dr. Tarri Glenn.  Procedure was discussed in detail with the patient including indications risk and benefits and she is agreeable to proceed. I have asked her to sign a release so we can obtain records from Dr. Leighton Ruff regarding follow-up for her prior diagnosis of anal AIN III.  Patient will follow-up with her gynecologist for any change or enlargement of the perineal skin lesion.   Ram Haugan S Yanna Leaks PA-C 11/05/2021   Cc: Enid Skeens., MD

## 2021-11-05 NOTE — Patient Instructions (Signed)
If you are age 52 or younger, your body mass index should be between 19-25. Your Body mass index is 38.41 kg/m. If this is out of the aformentioned range listed, please consider follow up with your Primary Care Provider.  ________________________________________________________  The [place] [facility] providers would like to encourage you to use [facility] to communicate with providers for non-urgent requests or questions.  Due to long hold times on the telephone, sending your provider a message by [facility] may be a faster and more efficient way to get a response.  Please allow 48 business hours for a response.  Please remember that this is for non-urgent requests.  _______________________________________________________  Maureen Wall have been scheduled for a colonoscopy. Please follow written instructions given to you at your visit today.  Please pick up your prep supplies at the pharmacy within the next 1-3 days. If you use inhalers (even only as needed), please bring them with you on the day of your procedure.  Follow up pending the results of your Colonoscopy or as needed.  Thank you for entrusting me with your care and choosing [facility].  Amy Esterwood, PA-C

## 2021-11-16 NOTE — Progress Notes (Signed)
Reviewed and agree with management plans. ? ?Craige Patel L. Baptiste Littler, MD, MPH  ?

## 2021-12-15 ENCOUNTER — Encounter: Payer: Self-pay | Admitting: Gastroenterology

## 2021-12-22 ENCOUNTER — Ambulatory Visit (AMBULATORY_SURGERY_CENTER): Payer: 59 | Admitting: Gastroenterology

## 2021-12-22 ENCOUNTER — Encounter: Payer: Self-pay | Admitting: Gastroenterology

## 2021-12-22 VITALS — BP 141/85 | HR 63 | Temp 98.0°F | Resp 19 | Ht 62.5 in | Wt 213.0 lb

## 2021-12-22 DIAGNOSIS — D120 Benign neoplasm of cecum: Secondary | ICD-10-CM | POA: Diagnosis not present

## 2021-12-22 DIAGNOSIS — Z1211 Encounter for screening for malignant neoplasm of colon: Secondary | ICD-10-CM

## 2021-12-22 MED ORDER — SODIUM CHLORIDE 0.9 % IV SOLN: 500.0000 mL | Freq: Once | INTRAVENOUS | Status: DC

## 2021-12-22 NOTE — Progress Notes (Signed)
Report to PACU, RN, vss, BBS= Clear.  

## 2021-12-22 NOTE — Progress Notes (Signed)
Called to room to assist during endoscopic procedure.  Patient ID and intended procedure confirmed with present staff. Received instructions for my participation in the procedure from the performing physician.  

## 2021-12-22 NOTE — Patient Instructions (Signed)
Handouts provided about diverticulosis, high fiber diet and polyps.  Continue present medications. Await pathology results.   YOU HAD AN ENDOSCOPIC PROCEDURE TODAY AT [facility]:   Refer to the procedure report that was given to you for any specific questions about what was found during the examination.  If the procedure report does not answer your questions, please call your gastroenterologist to clarify.  If you requested that your care partner not be given the details of your procedure findings, then the procedure report has been included in a sealed envelope for you to review at your convenience later.  YOU SHOULD EXPECT: Some feelings of bloating in the abdomen. Passage of more gas than usual.  Walking can help get rid of the air that was put into your GI tract during the procedure and reduce the bloating. If you had a lower endoscopy (such as a colonoscopy or flexible sigmoidoscopy) you may notice spotting of blood in your stool or on the toilet paper. If you underwent a bowel prep for your procedure, you may not have a normal bowel movement for a few days.  Please Note:  You might notice some irritation and congestion in your nose or some drainage.  This is from the oxygen used during your procedure.  There is no need for concern and it should clear up in a day or so.  SYMPTOMS TO REPORT IMMEDIATELY:  Following lower endoscopy (colonoscopy or flexible sigmoidoscopy):  Excessive amounts of blood in the stool  Significant tenderness or worsening of abdominal pains  Swelling of the abdomen that is new, acute  Fever of 100F or higher  For urgent or emergent issues, a gastroenterologist can be reached at any hour by calling [phone]. Do not use MyChart messaging for urgent concerns.    DIET:  We do recommend a small meal at first, but then you may proceed to your regular diet.  Drink plenty of fluids but you should avoid alcoholic beverages for 24 hours.  ACTIVITY:   You should plan to take it easy for the rest of today and you should NOT DRIVE or use heavy machinery until tomorrow (because of the sedation medicines used during the test).    FOLLOW UP: Our staff will call the number listed on your records the next business day following your procedure.  We will call around 7:15- 8:00 am to check on you and address any questions or concerns that you may have regarding the information given to you following your procedure. If we do not reach you, we will leave a message.     If any biopsies were taken you will be contacted by phone or by letter within the next 1-3 weeks.  Please call us at [phone] if you have not heard about the biopsies in 3 weeks.    SIGNATURES/CONFIDENTIALITY: You and/or your care partner have signed paperwork which will be entered into your electronic medical record.  These signatures attest to the fact that that the information above on your After Visit Summary has been reviewed and is understood.  Full responsibility of the confidentiality of this discharge information lies with you and/or your care-partner.

## 2021-12-22 NOTE — Progress Notes (Signed)
Referring Provider: Enid Skeens., MD Primary Care Physician:  Enid Skeens., MD  Indication for Colonoscopy:  Colon cancer screening   IMPRESSION:  Need for colon cancer screening Appropriate candidate for monitored anesthesia care  PLAN: Colonoscopy in the [place] today   HPI: Maureen Wall is a 51 y.o. female presents for screening colonoscopy.  No prior colonoscopy or colon cancer screening.  No known family history of colon cancer or polyps. No family history of uterine/endometrial cancer, pancreatic cancer or gastric/stomach cancer.  She has a history of anal AIN II-III diagnosed in 2019.  She was referred to Dr. Leighton Ruff and underwent anoscopy and excision with path showing AIN 3 HGSIL.She also has a history of vulvar AIN III-and is followed by her gynecologist Dr. Dellis Filbert.  Past Medical History:  Diagnosis Date   Allergy    seasonal   Anal intraepithelial neoplasia III (AIN III)    Anxiety    Cervix cancer ([place])    DUB (dysfunctional uterine bleeding)    History of cervical dysplasia    CIN 1   History of ectopic pregnancy    2001  S/P  RIGHT SALPINGECTOMY/  2004  S/P  LSO   History of ectopic pregnancy    History of vaginal dysplasia    VAIN 2/3  RECURRENT   History of vulvar dysplasia    2017 recurrent VIN 3   s/p  wle   Hyperlipidemia    Hypertension    Squamous cell carcinoma, leg, right    Wears contact lenses     Past Surgical History:  Procedure Laterality Date   CO2 LASER APPLICATION Left 64/40/3474   Procedure: CO2 LASER ABLATION OF ANTERIOR ANAL MUCOSA/ANAL VERGE LEFT LOWER LABIA MAJORA;  Surgeon: Everitt Amber, MD;  Location: [facility];  Service: Gynecology;  Laterality: Left;   COLONOSCOPY     EXPLORATORY LAPAROTOMY W/  LEFT SALPINGOOPHORECTOMY/  LYSIS ADHESIONS (ECTOPIC)  12/06/2002   EXPLORATORY LAPAROTOMY W/  RIGHT SALPINGECTOMY FOR ECTOPIC  05/07/1999   HIGH RESOLUTION ANOSCOPY N/A 12/09/2017   Procedure: HIGH  RESOLUTION ANOSCOPY WITH  BIOPSY;  Surgeon: Leighton Ruff, MD;  Location: [facility];  Service: General;  Laterality: N/A;   LASER ABLATION VAGINAL DYSPLASIA  11/10/1999   MOUTH SURGERY     VULVECTOMY Left 06/05/2015   Procedure: WIDE LOCAL EXCISION OF VULVA;  Surgeon: Everitt Amber, MD;  Location: [facility];  Service: Gynecology;  Laterality: Left;   WIDE LOCAL EXCISION LEFT LABIA MINORA/  LASER ABLATION OF CERVIX AND VAGINA  03/10/2000    Current Outpatient Medications  Medication Sig Dispense Refill   atenolol (TENORMIN) 25 MG tablet Take 25 mg by mouth daily.     atorvastatin (LIPITOR) 40 MG tablet Take 1 tablet by mouth daily.     calcium carbonate (OS-CAL) 600 MG TABS Take 600 mg by mouth 2 (two) times daily with a meal.       cholecalciferol (VITAMIN D) 1000 UNITS tablet Take 1,000 Units by mouth daily.     ezetimibe (ZETIA) 10 MG tablet Take 10 mg by mouth daily.     Current Facility-Administered Medications  Medication Dose Route Frequency Provider Last Rate Last Admin   0.9 %  sodium chloride infusion  500 mL Intravenous Once Thornton Park, MD        Allergies as of 12/22/2021 - Review Complete 12/22/2021  Allergen Reaction Noted   Morphine and related Itching 01/13/2011   Septra [bactrim] Nausea  And Vomiting 01/13/2011    Family History  Problem Relation Age of Onset   Diabetes Sister    Diabetes Brother    Hypertension Brother    Colon polyps Brother    Diabetes Maternal Grandmother    Heart disease Maternal Grandmother    Heart disease Maternal Grandfather    Heart disease Paternal Grandmother    Heart disease Paternal Grandfather    Colon cancer Neg Hx    Stomach cancer Neg Hx    Esophageal cancer Neg Hx    Rectal cancer Neg Hx      Physical Exam: General:   Alert,  well-nourished, pleasant and cooperative in NAD Head:  Normocephalic and atraumatic. Eyes:  Sclera clear, no icterus.   Conjunctiva pink. Mouth:  No  deformity or lesions.   Neck:  Supple; no masses or thyromegaly. Lungs:  Clear throughout to auscultation.   No wheezes. Heart:  Regular rate and rhythm; no murmurs. Abdomen:  Soft, non-tender, nondistended, normal bowel sounds, no rebound or guarding.  Msk:  Symmetrical. No boney deformities LAD: No inguinal or umbilical LAD Extremities:  No clubbing or edema. Neurologic:  Alert and  oriented x4;  grossly nonfocal Skin:  No obvious rash or bruise. Psych:  Alert and cooperative. Normal mood and affect.     Studies/Results: No results found.    Christipher Rieger L. Tarri Glenn, MD, MPH 12/22/2021, 9:06 AM

## 2021-12-22 NOTE — Op Note (Signed)
[facility] Patient Name: Maureen Wall Procedure Date: 12/22/2021 9:04 AM MRN: [id number] Endoscopist: Thornton Park MD, MD Age: 51 Referring MD:  Date of Birth: 04-Mar-1971 Gender: Female Account #: [id number] Procedure:                Colonoscopy Indications:              Screening for colorectal malignant neoplasm, This                            is the patient's first colonoscopy Medicines:                Monitored Anesthesia Care Procedure:                Pre-Anesthesia Assessment:                           - Prior to the procedure, a History and Physical                            was performed, and patient medications and                            allergies were reviewed. The patient's tolerance of                            previous anesthesia was also reviewed. The risks                            and benefits of the procedure and the sedation                            options and risks were discussed with the patient.                            All questions were answered, and informed consent                            was obtained. Prior Anticoagulants: The patient has                            taken no previous anticoagulant or antiplatelet                            agents. ASA Grade Assessment: II - A patient with                            mild systemic disease. After reviewing the risks                            and benefits, the patient was deemed in                            satisfactory condition to undergo the procedure.  After obtaining informed consent, the colonoscope                            was passed under direct vision. Throughout the                            procedure, the patient's blood pressure, pulse, and                            oxygen saturations were monitored continuously. The                            CF [id number] #[id number] was introduced through the anus                            and advanced to  the 3 cm into the ileum. A second                            forward view of the right colon was performed. The                            colonoscopy was performed without difficulty. The                            patient tolerated the procedure well. The quality                            of the bowel preparation was good. The terminal                            ileum, ileocecal valve, appendiceal orifice, and                            rectum were photographed. Scope In: 9:17:17 AM Scope Out: 9:30:06 AM Scope Withdrawal Time: 0 hours 10 minutes 28 seconds  Total Procedure Duration: 0 hours 12 minutes 49 seconds  Findings:                 Non-bleeding external and internal hemorrhoids were                            found.                           Multiple small and large-mouthed diverticula were                            found in the sigmoid colon and distal descending                            colon.                           A 3 mm polyp was found in the cecum. The polyp was  flat. The polyp was removed with a cold snare.                            Resection and retrieval were complete. Estimated                            blood loss was minimal.                           The exam was otherwise without abnormality on                            direct and retroflexion views. Complications:            No immediate complications. Estimated Blood Loss:     Estimated blood loss was minimal. Impression:               - Non-bleeding external and internal hemorrhoids.                           - Diverticulosis in the sigmoid colon and in the                            distal descending colon.                           - One 3 mm polyp in the cecum, removed with a cold                            snare. Resected and retrieved.                           - The examination was otherwise normal on direct                            and retroflexion  views. Recommendation:           - Patient has a contact number available for                            emergencies. The signs and symptoms of potential                            delayed complications were discussed with the                            patient. Return to normal activities tomorrow.                            Written discharge instructions were provided to the                            patient.                           - High fiber diet.                           -  Continue present medications.                           - Await pathology results.                           - Repeat colonoscopy date to be determined after                            pending pathology results are reviewed for                            surveillance.                           - Emerging evidence supports eating a diet of                            fruits, vegetables, grains, calcium, and yogurt                            while reducing red meat and alcohol may reduce the                            risk of colon cancer.                           - Thank you for allowing me to be involved in your                            colon cancer prevention. Thornton Park MD, MD 12/22/2021 9:33:42 AM This report has been signed electronically.

## 2021-12-23 ENCOUNTER — Telehealth: Payer: Self-pay

## 2021-12-23 NOTE — Telephone Encounter (Signed)
Left message on follow up call. 

## 2021-12-27 ENCOUNTER — Encounter: Payer: Self-pay | Admitting: Gastroenterology

## 2022-02-17 ENCOUNTER — Other Ambulatory Visit (HOSPITAL_COMMUNITY)
Admission: RE | Admit: 2022-02-17 | Discharge: 2022-02-17 | Disposition: A | Discharge status: Home / Self Care | Payer: 59 | Source: Ambulatory Visit | Attending: Obstetrics & Gynecology | Admitting: Obstetrics & Gynecology

## 2022-02-17 ENCOUNTER — Ambulatory Visit (INDEPENDENT_AMBULATORY_CARE_PROVIDER_SITE_OTHER): Payer: 59 | Admitting: Obstetrics & Gynecology

## 2022-02-17 ENCOUNTER — Encounter: Payer: Self-pay | Admitting: Obstetrics & Gynecology

## 2022-02-17 VITALS — BP 118/78 | HR 64 | Ht 62.25 in | Wt 218.0 lb

## 2022-02-17 DIAGNOSIS — Z23 Encounter for immunization: Secondary | ICD-10-CM | POA: Diagnosis not present

## 2022-02-17 DIAGNOSIS — Z01419 Encounter for gynecological examination (general) (routine) without abnormal findings: Secondary | ICD-10-CM | POA: Diagnosis present

## 2022-02-17 DIAGNOSIS — Z780 Asymptomatic menopausal state: Secondary | ICD-10-CM | POA: Diagnosis not present

## 2022-02-17 NOTE — Progress Notes (Signed)
Maureen Wall Jul 07, 1970 [id number]   History:    51 y.o. G3P0A3 Married.  Bilateral Salpingectomy.  Adopted son is 3 yo entering the Onset.    RP:  Established patient presenting for annual gyn exam    HPI: Postmenopause, well on no HRT.  No PMB.  No pelvic pain.  No pain with intercourse. Pap Neg 01/2021.  Remote h/o Cryotherapy.  Pap reflex today.  Urine and bowel movements normal.  Breasts normal.  Will obtain Mammo from [facility] 2023.  Body mass index increased again to 39.55.  Lower calorie/carb diet, increase in fitness activities recommended.  Health labs with family physician. Followed by Dr. 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  Anoscopy Negative since. Colonoscopy benign polyp 12/2021.   Past medical history,surgical history, family history and social history were all reviewed and documented in the EPIC chart.  Gynecologic History No LMP recorded. Patient is postmenopausal.  Obstetric History OB History  Gravida Para Term Preterm AB Living  3 0     3 0  SAB IAB Ectopic Multiple Live Births  1   2        # Outcome Date GA Lbr Len/2nd Weight Sex Delivery Anes PTL Lv  3 SAB           2 Ectopic           1 Ectopic             Obstetric Comments  1 adopted child     ROS: A ROS was performed and pertinent positives and negatives are included in the history. GENERAL: No fevers or chills. HEENT: No change in vision, no earache, sore throat or sinus congestion. NECK: No pain or stiffness. CARDIOVASCULAR: No chest pain or pressure. No palpitations. PULMONARY: No shortness of breath, cough or wheeze. GASTROINTESTINAL: No abdominal pain, nausea, vomiting or diarrhea, melena or bright red blood per rectum. GENITOURINARY: No urinary frequency, urgency, hesitancy or dysuria. MUSCULOSKELETAL: No joint or muscle pain, no back pain, no recent trauma. DERMATOLOGIC: No rash, no itching, no lesions. ENDOCRINE: No polyuria, polydipsia, no heat or cold  intolerance. No recent change in weight. HEMATOLOGICAL: No anemia or easy bruising or bleeding. NEUROLOGIC: No headache, seizures, numbness, tingling or weakness. PSYCHIATRIC: No depression, no loss of interest in normal activity or change in sleep pattern.     Exam:   BP 118/78   Pulse 64   Ht 5' 2.25" (1.581 m)   Wt 218 lb (98.9 kg)   SpO2 98%   BMI 39.55 kg/m   Body mass index is 39.55 kg/m.  General appearance : Well developed well nourished female. No acute distress HEENT: Eyes: no retinal hemorrhage or exudates,  Neck supple, trachea midline, no carotid bruits, no thyroidmegaly Lungs: Clear to auscultation, no rhonchi or wheezes, or rib retractions  Heart: Regular rate and rhythm, no murmurs or gallops Breast:Examined in sitting and supine position were symmetrical in appearance, no palpable masses or tenderness,  no skin retraction, no nipple inversion, no nipple discharge, no skin discoloration, no axillary or supraclavicular lymphadenopathy Abdomen: no palpable masses or tenderness, no rebound or guarding Extremities: no edema or skin discoloration or tenderness  Pelvic: Vulva: Normal             Vagina: No gross lesions or discharge  Cervix: No gross lesions or discharge.  Pap reflex done.  Uterus  AV, normal size, shape and consistency, non-tender and mobile  Adnexa  Without  masses or tenderness  Anus: Normal.  Small skin tag perianally.   Assessment/Plan:  51 y.o. female for annual exam   1. Encounter for routine gynecological examination with Papanicolaou smear of cervix Postmenopause, well on no HRT.  No PMB.  No pelvic pain.  No pain with intercourse. Pap Neg 01/2021.  Remote h/o Cryotherapy.  Pap reflex today.  Urine and bowel movements normal.  Breasts normal.  Will obtain Mammo from [facility] 2023.  Body mass index increased again to 39.55.  Lower calorie/carb diet, increase in fitness activities recommended.  Health labs with family physician. Followed by Dr. 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  Anoscopy Negative since. Colonoscopy benign polyp 12/2021. - Cytology - PAP( [facility])  2. Postmenopause Postmenopause, well on no HRT.  No PMB.  No pelvic pain.  No pain with intercourse.  3. Class 2 severe obesity due to excess calories with serious comorbidity and body mass index (BMI) of 39.0 to 39.9 in adult [facility])  Body mass index increased again to 39.55.  Lower calorie/carb diet, increase in fitness activities recommended.   [name] MD, 8:17 AM 02/17/2022

## 2022-02-18 LAB — CYTOLOGY - PAP: Diagnosis: NEGATIVE

## 2022-06-23 ENCOUNTER — Encounter: Payer: Self-pay | Admitting: Obstetrics & Gynecology

## 2022-07-05 ENCOUNTER — Encounter: Payer: Self-pay | Admitting: Obstetrics & Gynecology

## 2023-02-22 ENCOUNTER — Ambulatory Visit: Payer: 59 | Admitting: Obstetrics & Gynecology

## 2023-02-22 ENCOUNTER — Ambulatory Visit (INDEPENDENT_AMBULATORY_CARE_PROVIDER_SITE_OTHER): Payer: 59 | Admitting: Obstetrics and Gynecology

## 2023-02-22 ENCOUNTER — Encounter: Payer: Self-pay | Admitting: Obstetrics and Gynecology

## 2023-02-22 ENCOUNTER — Other Ambulatory Visit (HOSPITAL_COMMUNITY)
Admission: RE | Admit: 2023-02-22 | Discharge: 2023-02-22 | Disposition: A | Discharge status: Home / Self Care | Payer: 59 | Source: Ambulatory Visit | Attending: Obstetrics and Gynecology | Admitting: Obstetrics and Gynecology

## 2023-02-22 VITALS — BP 120/76 | HR 71 | Ht 62.99 in | Wt 207.0 lb

## 2023-02-22 DIAGNOSIS — E66812 Obesity, class 2: Secondary | ICD-10-CM | POA: Diagnosis not present

## 2023-02-22 DIAGNOSIS — Z6836 Body mass index (BMI) 36.0-36.9, adult: Secondary | ICD-10-CM

## 2023-02-22 DIAGNOSIS — D072 Carcinoma in situ of vagina: Secondary | ICD-10-CM | POA: Diagnosis not present

## 2023-02-22 DIAGNOSIS — D071 Carcinoma in situ of vulva: Secondary | ICD-10-CM

## 2023-02-22 DIAGNOSIS — Z01419 Encounter for gynecological examination (general) (routine) without abnormal findings: Secondary | ICD-10-CM | POA: Diagnosis not present

## 2023-02-22 DIAGNOSIS — N891 Moderate vaginal dysplasia: Secondary | ICD-10-CM

## 2023-02-22 NOTE — Assessment & Plan Note (Signed)
Desires yearly PAP

## 2023-02-22 NOTE — Assessment & Plan Note (Signed)
 Cervical cancer screening performed according to ASCCP guidelines. Encouraged annual mammogram screening Colonoscopy UTD DXA N/A Labs and immunizations with her primary Encouraged safe sexual practices as indicated Encouraged healthy lifestyle practices with diet and exercise For patients under 50-53yo, I recommend 1200mg  calcium daily and 600IU of vitamin D daily.

## 2023-02-22 NOTE — Patient Instructions (Signed)
For patients under 50-53yo, I recommend 1200mg  calcium daily and 600IU of vitamin D daily. For patients over 53yo, I recommend 1200mg  calcium daily and 800IU of vitamin D daily.  Health Maintenance, Female Adopting a healthy lifestyle and getting preventive care are important in promoting health and wellness. Ask your health care provider about: The right schedule for you to have regular tests and exams. Things you can do on your own to prevent diseases and keep yourself healthy. What should I know about diet, weight, and exercise? Eat a healthy diet  Eat a diet that includes plenty of vegetables, fruits, low-fat dairy products, and lean protein. Do not eat a lot of foods that are high in solid fats, added sugars, or sodium. Maintain a healthy weight Body mass index (BMI) is used to identify weight problems. It estimates body fat based on height and weight. Your health care provider can help determine your BMI and help you achieve or maintain a healthy weight. Get regular exercise Get regular exercise. This is one of the most important things you can do for your health. Most adults should: Exercise for at least 150 minutes each week. The exercise should increase your heart rate and make you sweat (moderate-intensity exercise). Do strengthening exercises at least twice a week. This is in addition to the moderate-intensity exercise. Spend less time sitting. Even light physical activity can be beneficial. Watch cholesterol and blood lipids Have your blood tested for lipids and cholesterol at 53 years of age, then have this test every 5 years. Have your cholesterol levels checked more often if: Your lipid or cholesterol levels are high. You are older than 53 years of age. You are at high risk for heart disease. What should I know about cancer screening? Depending on your health history and family history, you may need to have cancer screening at various ages. This may include screening  for: Breast cancer. Cervical cancer. Colorectal cancer. Skin cancer. Lung cancer. What should I know about heart disease, diabetes, and high blood pressure? Blood pressure and heart disease High blood pressure causes heart disease and increases the risk of stroke. This is more likely to develop in people who have high blood pressure readings or are overweight. Have your blood pressure checked: Every 3-5 years if you are 83-53 years of age. Every year if you are 4 years old or older. Diabetes Have regular diabetes screenings. This checks your fasting blood sugar level. Have the screening done: Once every three years after age 68 if you are at a normal weight and have a low risk for diabetes. More often and at a younger age if you are overweight or have a high risk for diabetes. What should I know about preventing infection? Hepatitis B If you have a higher risk for hepatitis B, you should be screened for this virus. Talk with your health care provider to find out if you are at risk for hepatitis B infection. Hepatitis C Testing is recommended for: Everyone born from [birth year] through 1966. Anyone with known risk factors for hepatitis C. Sexually transmitted infections (STIs) Get screened for STIs, including gonorrhea and chlamydia, if: You are sexually active and are younger than 53 years of age. You are older than 53 years of age and your health care provider tells you that you are at risk for this type of infection. Your sexual activity has changed since you were last screened, and you are at increased risk for chlamydia or gonorrhea. Ask your health care provider if  you are at risk. Ask your health care provider about whether you are at high risk for HIV. Your health care provider may recommend a prescription medicine to help prevent HIV infection. If you choose to take medicine to prevent HIV, you should first get tested for HIV. You should then be tested every 3 months for as long as you  are taking the medicine. Osteoporosis and menopause Osteoporosis is a disease in which the bones lose minerals and strength with aging. This can result in bone fractures. If you are 44 years old or older, or if you are at risk for osteoporosis and fractures, ask your health care provider if you should: Be screened for bone loss. Take a calcium or vitamin D supplement to lower your risk of fractures. Be given hormone replacement therapy (HRT) to treat symptoms of menopause. Follow these instructions at home: Alcohol use Do not drink alcohol if: Your health care provider tells you not to drink. You are pregnant, may be pregnant, or are planning to become pregnant. If you drink alcohol: Limit how much you have to: 0-1 drink a day. Know how much alcohol is in your drink. In the [place] one drink equals one 12 oz bottle of beer (355 mL), one 5 oz glass of wine (148 mL), or one 1 oz glass of hard liquor (44 mL). Lifestyle Do not use any products that contain nicotine or tobacco. These products include cigarettes, chewing tobacco, and vaping devices, such as e-cigarettes. If you need help quitting, ask your health care provider. Do not use street drugs. Do not share needles. Ask your health care provider for help if you need support or information about quitting drugs. General instructions Schedule regular health, dental, and eye exams. Stay current with your vaccines. Tell your health care provider if: You often feel depressed. You have ever been abused or do not feel safe at home. Summary Adopting a healthy lifestyle and getting preventive care are important in promoting health and wellness. Follow your health care provider's instructions about healthy diet, exercising, and getting tested or screened for diseases. Follow your health care provider's instructions on monitoring your cholesterol and blood pressure. This information is not intended to replace advice given to you by your health  care provider. Make sure you discuss any questions you have with your health care provider. Document Revised: 08/18/2020 Document Reviewed: 08/18/2020 Elsevier Patient Education  2024 [employer].

## 2023-02-22 NOTE — Progress Notes (Signed)
52 y.o. 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 Married.  Adopted son, entering the [employer].  Works as an Gaffer.  Patient's last menstrual period was 08/07/2019.   Abnormal bleeding: none Pelvic discharge or pain: none Breast mass, nipple discharge or skin changes : none Last PAP:     Component Value Date/Time   DIAGPAP  02/17/2022 0834    - Negative for intraepithelial lesion or malignancy (NILM)   DIAGPAP  02/06/2021 0849    - Negative for intraepithelial lesion or malignancy (NILM)   ADEQPAP  02/17/2022 0834    Satisfactory for evaluation; transformation zone component PRESENT.   ADEQPAP  02/06/2021 0849    Satisfactory for evaluation; transformation zone component PRESENT.   Last mammogram: 07/02/22 BIRADS 3, probably benign- 6mm and 5mm right breast cyst with reactive LAD, density b Last colonoscopy: 12/22/21, +polyps Sexually active: married  Exercising: infrequently, however lost weight with weight watchers  GYN HISTORY: 2001 Left Vaginal Fornix VAIN II CO2 Laser treatment  2017 VIN3 and AIN 2&3 s/p partial simple left vulvectomy and anal CO2 laser ablation (Dr. Andrey Farmer, GYN ONC) 2019 Anal biopsies in August 2019 showing AIN 2 and 3. Anoscopy Negative since. Colonoscopy benign polyp 12/2021 (followed by Dr. Freddy Jaksch)  OB History  Gravida Para Term Preterm AB Living  3 0     3 0  SAB IAB Ectopic Multiple Live Births  1   2        # Outcome Date GA Lbr Len/2nd Weight Sex Type Anes PTL Lv  3 SAB           2 Ectopic           1 Ectopic             Obstetric Comments  1 adopted child    Past Medical History:  Diagnosis Date   Allergy    seasonal   Anal intraepithelial neoplasia III (AIN III)    Anxiety    Cervix cancer (HCC)    DUB (dysfunctional uterine bleeding)    History of  cervical dysplasia    CIN 1   History of ectopic pregnancy    2001  S/P  RIGHT SALPINGECTOMY/  2004  S/P  LSO   History of ectopic pregnancy    History of vaginal dysplasia    VAIN 2/3  RECURRENT   History of vulvar dysplasia    2017 recurrent VIN 3   s/p  wle   Hyperlipidemia    Hypertension    Squamous cell carcinoma, leg, right    Wears contact lenses     Past Surgical History:  Procedure Laterality Date   CO2 LASER APPLICATION Left 06/05/2015   Procedure: CO2 LASER ABLATION OF ANTERIOR ANAL MUCOSA/ANAL VERGE LEFT LOWER LABIA MAJORA;  Surgeon: Adolphus Birchwood, MD;  Location: [facility];  Service: Gynecology;  Laterality: Left;   COLONOSCOPY     EXPLORATORY LAPAROTOMY W/  LEFT SALPINGOOPHORECTOMY/  LYSIS ADHESIONS (ECTOPIC)  12/06/2002   EXPLORATORY LAPAROTOMY W/  RIGHT SALPINGECTOMY FOR ECTOPIC  05/07/1999   HIGH RESOLUTION ANOSCOPY N/A 12/09/2017   Procedure: HIGH RESOLUTION ANOSCOPY WITH  BIOPSY;  Surgeon: Romie Levee, MD;  Location: [facility];  Service: General;  Laterality: N/A;   LASER ABLATION VAGINAL DYSPLASIA  11/10/1999   MOUTH SURGERY  VULVECTOMY Left 06/05/2015   Procedure: WIDE LOCAL EXCISION OF VULVA;  Surgeon: Adolphus Birchwood, MD;  Location: [facility]  Service: Gynecology;  Laterality: Left;   WIDE LOCAL EXCISION LEFT LABIA MINORA/  LASER ABLATION OF CERVIX AND VAGINA  03/10/2000    Current Outpatient Medications on File Prior to Visit  Medication Sig Dispense Refill   atenolol (TENORMIN) 25 MG tablet Take 25 mg by mouth daily.     atorvastatin (LIPITOR) 40 MG tablet Take 1 tablet by mouth daily.     calcium carbonate (OS-CAL) 600 MG TABS Take 600 mg by mouth 2 (two) times daily with a meal.       cholecalciferol (VITAMIN D) 1000 UNITS tablet Take 1,000 Units by mouth daily.     ezetimibe (ZETIA) 10 MG tablet Take 10 mg by mouth daily.     No current facility-administered medications on file prior to visit.     Social History   Socioeconomic History   Marital status: Married    Spouse name: Not on file   Number of children: 1   Years of education: Not on file   Highest education level: Not on file  Occupational History   Occupation: Production designer, theatre/television/film  Tobacco Use   Smoking status: Every Day    Current packs/day: 0.50    Average packs/day: 0.5 packs/day for 32.0 years (16.0 ttl pk-yrs)    Types: Cigarettes   Smokeless tobacco: Never  Vaping Use   Vaping status: Never Used  Substance and Sexual Activity   Alcohol use: Yes    Comment: OCCASIONALLY   Drug use: No   Sexual activity: Yes    Partners: Male    Birth control/protection: Post-menopausal  Other Topics Concern   Not on file  Social History Narrative   Not on file   Social Determinants of Health   Financial Resource Strain: Not on file  Food Insecurity: Not on file  Transportation Needs: Not on file  Physical Activity: Not on file  Stress: Not on file  Social Connections: Not on file  Intimate Partner Violence: Not on file    Family History  Problem Relation Age of Onset   Diabetes Sister    Diabetes Brother    Hypertension Brother    Colon polyps Brother    Diabetes Maternal Grandmother    Heart disease Maternal Grandmother    Heart disease Maternal Grandfather    Heart disease Paternal Grandmother    Heart disease Paternal Grandfather    Colon cancer Neg Hx    Stomach cancer Neg Hx    Esophageal cancer Neg Hx    Rectal cancer Neg Hx     Allergies  Allergen Reactions   Morphine And Codeine Itching    SEVERE   Septra [Bactrim] Nausea And Vomiting    GI  UPSET      PE Today's Vitals   02/22/23 0757  BP: 120/76  Pulse: 71  SpO2: 97%  Weight: 207 lb (93.9 kg)  Height: 5' 2.99" (1.6 m)   Body mass index is 36.68 kg/m.  Physical Exam Vitals reviewed. Exam conducted with a chaperone present.  Constitutional:      General: She is not in acute distress.    Appearance: Normal appearance.  HENT:      Head: Normocephalic and atraumatic.     Nose: Nose normal.  Eyes:     Extraocular Movements: Extraocular movements intact.     Conjunctiva/sclera: Conjunctivae normal.  Neck:     Thyroid: No  thyroid mass, thyromegaly or thyroid tenderness.  Pulmonary:     Effort: Pulmonary effort is normal.  Chest:     Chest wall: No mass or tenderness.  Breasts:    Right: Normal. No swelling, mass, nipple discharge, skin change or tenderness.     Left: Normal. No swelling, mass, nipple discharge, skin change or tenderness.  Abdominal:     General: There is no distension.     Palpations: Abdomen is soft.     Tenderness: There is no abdominal tenderness.  Genitourinary:    General: Normal vulva.     Exam position: Lithotomy position.     Urethra: No prolapse.     Vagina: Normal. No vaginal discharge or bleeding.     Cervix: Normal. No lesion.     Uterus: Normal. Not enlarged and not tender.      Adnexa: Right adnexa normal and left adnexa normal.  Musculoskeletal:        General: Normal range of motion.     Cervical back: Normal range of motion.  Lymphadenopathy:     Upper Body:     Right upper body: No axillary adenopathy.     Left upper body: No axillary adenopathy.     Lower Body: No right inguinal adenopathy. No left inguinal adenopathy.  Skin:    General: Skin is warm and dry.  Neurological:     General: No focal deficit present.     Mental Status: She is alert.  Psychiatric:        Mood and Affect: Mood normal.        Behavior: Behavior normal.       Assessment and Plan:        Well woman exam with routine gynecological exam Assessment & Plan: Cervical cancer screening performed according to ASCCP guidelines. Encouraged annual mammogram screening Colonoscopy UTD DXA N/A Labs and immunizations with her primary Encouraged safe sexual practices as indicated Encouraged healthy lifestyle practices with diet and exercise For patients under 50-70yo, I recommend 1200mg  calcium  daily and 600IU of vitamin D daily.   Class 2 severe obesity due to excess calories with serious comorbidity and body mass index (BMI) of 36.0 to 36.9 in adult [facility]) Assessment & Plan: Encouraged healthy diet and exercise.    VIN III (vulvar intraepithelial neoplasia III) Assessment & Plan: Desires yearly PAP  Orders: -     Cytology - PAP  VAIN II (vaginal intraepithelial neoplasia grade II) Assessment & Plan: Desires yearly PAP  Orders: -     Cytology - PAP  Vaginal intraepithelial neoplasia III (VAIN III) Assessment & Plan: Desires yearly PAP     Friend Dorfman Lasandra Beech, MD

## 2023-02-22 NOTE — Assessment & Plan Note (Signed)
Encouraged healthy diet and exercise

## 2023-02-23 LAB — CYTOLOGY - PAP
Comment: NEGATIVE
Diagnosis: NEGATIVE
High risk HPV: NEGATIVE

## 2023-04-21 ENCOUNTER — Ambulatory Visit: Payer: 59 | Admitting: Nurse Practitioner

## 2023-04-21 ENCOUNTER — Encounter: Payer: Self-pay | Admitting: Nurse Practitioner

## 2023-04-21 VITALS — BP 120/88 | HR 72

## 2023-04-21 DIAGNOSIS — R1030 Lower abdominal pain, unspecified: Secondary | ICD-10-CM

## 2023-04-21 DIAGNOSIS — R143 Flatulence: Secondary | ICD-10-CM | POA: Diagnosis not present

## 2023-04-21 LAB — URINALYSIS, COMPLETE W/RFL CULTURE
Bacteria, UA: NONE SEEN /[HPF]
Bilirubin Urine: NEGATIVE
Glucose, UA: NEGATIVE
Hgb urine dipstick: NEGATIVE
Hyaline Cast: NONE SEEN /[LPF]
Ketones, ur: NEGATIVE
Leukocyte Esterase: NEGATIVE
Nitrites, Initial: NEGATIVE
Protein, ur: NEGATIVE
RBC / HPF: NONE SEEN /[HPF] (ref 0–2)
Specific Gravity, Urine: 1.01 (ref 1.001–1.035)
WBC, UA: NONE SEEN /[HPF] (ref 0–5)
pH: 6 (ref 5.0–8.0)

## 2023-04-21 LAB — NO CULTURE INDICATED

## 2023-04-21 NOTE — Progress Notes (Signed)
   Acute Office Visit  Subjective:    Patient ID: Maureen Wall, female    DOB: 1971-02-12, 53 y.o.   MRN: [id number]   HPI 53 y.o. presents today for lower abdominal pain since last night. Feels like it is similar to gas pain but is lower than it normally is for her so she was worried about a bladder infection. Denies dysuria, frequency, urgency, hematuria, flank pain. Denies vaginal symptoms. BM this morning. Has had a diet change and is eating more fiber. Passed gas last night and took pepto with some relief but then woke up overnight with pain. Sometimes pain feels higher, like it moves. Colonoscopy in 2023 showed multiple diverticula. No history of diverticulitis.   Patient's last menstrual period was 08/07/2019.    Review of Systems  Constitutional: Negative.   Gastrointestinal:  Positive for abdominal pain. Negative for constipation, diarrhea, nausea and vomiting.  Genitourinary:  Negative for difficulty urinating, dysuria, flank pain, frequency, hematuria, urgency and vaginal discharge.       Objective:    Physical Exam Constitutional:      Appearance: Normal appearance.  Abdominal:     Tenderness: There is abdominal tenderness in the suprapubic area. There is no guarding or rebound.  Genitourinary:    General: Normal vulva.     Uterus: Normal.      Adnexa: Right adnexa normal and left adnexa normal.     BP 120/88   Pulse 72   LMP 08/07/2019   SpO2 98%  Wt Readings from Last 3 Encounters:  02/22/23 207 lb (93.9 kg)  02/17/22 218 lb (98.9 kg)  12/22/21 213 lb (96.6 kg)        Patient informed chaperone available to be present for breast and/or pelvic exam. Patient has requested no chaperone to be present. Patient has been advised what will be completed during breast and pelvic exam.   Assessment & Plan:   Problem List Items Addressed This Visit   None Visit Diagnoses       Lower abdominal pain    -  Primary   Relevant Orders   Urinalysis,Complete  w/RFL Culture     Flatulence          Plan: UA negative. Appears to be GI-related. Likely gas due to diet change. Recommend Gas-x, increase water intake and physical activity, start probiotic. If no improvement in pain by tomorrow will follow up with PCP.   Return if symptoms worsen or fail to improve.    Maureen DELENA Shutter DNP, 9:57 AM 04/21/2023

## 2023-12-25 ENCOUNTER — Ambulatory Visit (HOSPITAL_BASED_OUTPATIENT_CLINIC_OR_DEPARTMENT_OTHER)
Admission: EM | Admit: 2023-12-25 | Discharge: 2023-12-25 | Disposition: A | Discharge status: Home / Self Care | Attending: Family Medicine | Admitting: Family Medicine

## 2023-12-25 ENCOUNTER — Encounter (HOSPITAL_BASED_OUTPATIENT_CLINIC_OR_DEPARTMENT_OTHER): Payer: Self-pay

## 2023-12-25 DIAGNOSIS — H00012 Hordeolum externum right lower eyelid: Secondary | ICD-10-CM | POA: Diagnosis not present

## 2023-12-25 MED ORDER — ERYTHROMYCIN 5 MG/GM OP OINT: TOPICAL_OINTMENT | OPHTHALMIC | 0 refills | Status: AC

## 2023-12-25 NOTE — Discharge Instructions (Addendum)
 Treating you for a stye.  Warm compresses and to the area and erythromycin  ointment 3-4 times a day to the area. Keep area clean  Follow-up as needed

## 2023-12-25 NOTE — ED Provider Notes (Signed)
 [facility]    CSN: [id number] Arrival date & time: 12/25/23  0915      History   Chief Complaint Chief Complaint  Patient presents with   Eye Pain    HPI Maureen Wall is a 53 y.o. female.   Patient is a 53 year old female who presents today with right eye irritation . patient states mother recently treated for pink eye. Patient noticed irritation to right eye last night. States there is a red bump on her lower eyelid and thinks there is a bump on her eyeball.  Denies pain or itching.    Eye Pain    Past Medical History:  Diagnosis Date   Allergy    seasonal   Anal intraepithelial neoplasia III (AIN III)    Anxiety    Cervix cancer (HCC)    DUB (dysfunctional uterine bleeding)    History of cervical dysplasia    CIN 1   History of ectopic pregnancy    2001  S/P  RIGHT SALPINGECTOMY/  2004  S/P  LSO   History of ectopic pregnancy    History of vaginal dysplasia    VAIN 2/3  RECURRENT   History of vulvar dysplasia    2017 recurrent VIN 3   s/p  wle   Hyperlipidemia    Hypertension    Squamous cell carcinoma, leg, right    Wears contact lenses     Patient Active Problem List   Diagnosis Date Noted   Well woman exam with routine gynecological exam 02/22/2023   Class 2 severe obesity due to excess calories with serious comorbidity and body mass index (BMI) of 36.0 to 36.9 in adult (HCC) 02/22/2023   AIN III (anal intraepithelial neoplasia III) 05/19/2016   Condyloma acuminatum of vulva 05/13/2015   Anxiety 05/08/2015   VIN III (vulvar intraepithelial neoplasia III) 03/20/2015   Ovarian cyst, right 11/02/2013   VAIN II (vaginal intraepithelial neoplasia grade II) 03/07/2012   Hx of ectopic pregnancy 02/10/2012   Cervical dysplasia, mild 02/10/2012   Weight gain 02/10/2012   Smoker 02/10/2012   Vaginal intraepithelial neoplasia III (VAIN III) 01/22/2011   Hypercholesteremia 01/22/2011    Past Surgical History:  Procedure Laterality Date    CO2 LASER APPLICATION Left 06/05/2015   Procedure: CO2 LASER ABLATION OF ANTERIOR ANAL MUCOSA/ANAL VERGE LEFT LOWER LABIA MAJORA;  Surgeon: Maurilio Ship, MD;  Location: [facility];  Service: Gynecology;  Laterality: Left;   COLONOSCOPY     EXPLORATORY LAPAROTOMY W/  LEFT SALPINGOOPHORECTOMY/  LYSIS ADHESIONS (ECTOPIC)  12/06/2002   EXPLORATORY LAPAROTOMY W/  RIGHT SALPINGECTOMY FOR ECTOPIC  05/07/1999   HIGH RESOLUTION ANOSCOPY N/A 12/09/2017   Procedure: HIGH RESOLUTION ANOSCOPY WITH  BIOPSY;  Surgeon: Debby Hila, MD;  Location[facility];  Service: General;  Laterality: N/A;   LASER ABLATION VAGINAL DYSPLASIA  11/10/1999   MOUTH SURGERY     VULVECTOMY Left 06/05/2015   Procedure: WIDE LOCAL EXCISION OF VULVA;  Surgeon: Maurilio Ship, MD;  Location: [facility] [place];  Service: Gynecology;  Laterality: Left;   WIDE LOCAL EXCISION LEFT LABIA MINORA/  LASER ABLATION OF CERVIX AND VAGINA  03/10/2000    OB History     Gravida  3   Para  0   Term      Preterm      AB  3   Living  0      SAB  1   IAB      Ectopic  2   Multiple      Live Births           Obstetric Comments  1 adopted child          Home Medications    Prior to Admission medications   Medication Sig Start Date End Date Taking? Authorizing Provider  erythromycin  ophthalmic ointment Place a 1/2 inch ribbon of ointment into the lower eyelid 3-4 times a day 12/25/23  Yes Zanden Colver A, FNP  atenolol (TENORMIN) 25 MG tablet Take 25 mg by mouth daily. 10/21/21   [provider]  atorvastatin (LIPITOR) 40 MG tablet Take 1 tablet by mouth daily. 07/16/20   [provider]  calcium carbonate (OS-CAL) 600 MG TABS Take 600 mg by mouth 2 (two) times daily with a meal.      [provider]  cholecalciferol (VITAMIN D) 1000 UNITS tablet Take 1,000 Units by mouth daily.    [provider]  ezetimibe (ZETIA) 10 MG tablet Take 10 mg by mouth  daily. 10/07/21   [provider]    Family History Family History  Problem Relation Age of Onset   Diabetes Sister    Diabetes Brother    Hypertension Brother    Colon polyps Brother    Diabetes Maternal Grandmother    Heart disease Maternal Grandmother    Heart disease Maternal Grandfather    Heart disease Paternal Grandmother    Heart disease Paternal Grandfather    Colon cancer Neg Hx    Stomach cancer Neg Hx    Esophageal cancer Neg Hx    Rectal cancer Neg Hx     Social History Social History   Tobacco Use   Smoking status: Every Day    Current packs/day: 0.50    Average packs/day: 0.5 packs/day for 32.0 years (16.0 ttl pk-yrs)    Types: Cigarettes   Smokeless tobacco: Never  Vaping Use   Vaping status: Never Used  Substance Use Topics   Alcohol use: Yes    Comment: OCCASIONALLY   Drug use: No     Allergies   Morphine and codeine and Septra [bactrim]   Review of Systems Review of Systems  Eyes:  Positive for pain.   See HPI   Physical Exam Triage Vital Signs ED Triage Vitals  Encounter Vitals Group     BP 12/25/23 1052 137/84     Girls Systolic BP Percentile --      Girls Diastolic BP Percentile --      Boys Systolic BP Percentile --      Boys Diastolic BP Percentile --      Pulse Rate 12/25/23 1052 (!) 45     Resp 12/25/23 1052 20     Temp 12/25/23 1052 97.8 F (36.6 C)     Temp Source 12/25/23 1052 Oral     SpO2 12/25/23 1052 96 %     Weight --      Height --      Head Circumference --      Peak Flow --      Pain Score 12/25/23 1054 0     Pain Loc --      Pain Education --      Exclude from Growth Chart --    No data found.  Updated Vital Signs BP 137/84 (BP Location: Right Arm)   Pulse (!) 45   Temp 97.8 F (36.6 C) (Oral)   Resp 20   LMP 08/07/2019   SpO2 96%  Visual Acuity Right Eye Distance:   Left Eye Distance:   Bilateral Distance:    Right Eye Near:   Left Eye Near:    Bilateral Near:     Physical  Exam Vitals and nursing note reviewed.  Constitutional:      General: She is not in acute distress.    Appearance: Normal appearance. She is not ill-appearing, toxic-appearing or diaphoretic.  Eyes:     General:        Right eye: No discharge.        Left eye: No discharge.     Conjunctiva/sclera: Conjunctivae normal.     Comments: Small stye to right lower lid with mild erythema   Pulmonary:     Effort: Pulmonary effort is normal.  Neurological:     Mental Status: She is alert.  Psychiatric:        Mood and Affect: Mood normal.      UC Treatments / Results  Labs (all labs ordered are listed, but only abnormal results are displayed) Labs Reviewed - No data to display  EKG   Radiology No results found.  Procedures Procedures (including critical care time)  Medications Ordered in UC Medications - No data to display  Initial Impression / Assessment and Plan / UC Course  I have reviewed the triage vital signs and the nursing notes.  Pertinent labs & imaging results that were available during my care of the patient were reviewed by me and considered in my medical decision making (see chart for details).     Stye -recommend warm compresses to the area 3-4 times a day.  Will prescribe erythromycin  ointment to apply to the area also 3-4 times a day for bacterial coverage.  Recommend keep area clean and follow-up as needed Final Clinical Impressions(s) / UC Diagnoses   Final diagnoses:  Hordeolum externum of right lower eyelid     Discharge Instructions      Treating you for a stye.  Warm compresses and to the area and erythromycin  ointment 3-4 times a day to the area. Keep area clean  Follow-up as needed    ED Prescriptions     Medication Sig Dispense Auth. Provider   erythromycin  ophthalmic ointment Place a 1/2 inch ribbon of ointment into the lower eyelid 3-4 times a day 3.5 g Adah Corning A, FNP      PDMP not reviewed this encounter.   Adah Corning LABOR,  FNP 12/25/23 1156

## 2023-12-25 NOTE — ED Triage Notes (Signed)
 Patient states mother recently treated for pink eye. Patient noticed irritation to right eye last night. States there is a red bump on her lower eyelid and thinks there is a bump on her eyeball. This nurse unable to visualize that. Denies pain.

## 2024-02-24 ENCOUNTER — Ambulatory Visit: Payer: 59 | Admitting: Obstetrics and Gynecology
